# Patient Record
Sex: Male | Born: 1960 | Race: White | Hispanic: No | Marital: Married | Smoking: Never smoker
Health system: Southern US, Community
[De-identification: ages and names within clinical notes are randomized; demographics above are authoritative.]

## PROBLEM LIST (undated history)

## (undated) DIAGNOSIS — C4492 Squamous cell carcinoma of skin, unspecified: Secondary | ICD-10-CM

## (undated) DIAGNOSIS — Z8546 Personal history of malignant neoplasm of prostate: Secondary | ICD-10-CM

## (undated) DIAGNOSIS — C4491 Basal cell carcinoma of skin, unspecified: Secondary | ICD-10-CM

## (undated) DIAGNOSIS — L570 Actinic keratosis: Secondary | ICD-10-CM

## (undated) DIAGNOSIS — N529 Male erectile dysfunction, unspecified: Secondary | ICD-10-CM

## (undated) DIAGNOSIS — N200 Calculus of kidney: Secondary | ICD-10-CM

## (undated) HISTORY — DX: Male erectile dysfunction, unspecified: N52.9

## (undated) HISTORY — DX: Personal history of malignant neoplasm of prostate: Z85.46

## (undated) HISTORY — DX: Squamous cell carcinoma of skin, unspecified: C44.92

## (undated) HISTORY — DX: Actinic keratosis: L57.0

## (undated) HISTORY — DX: Calculus of kidney: N20.0

## (undated) HISTORY — PX: PROSTATE SURGERY: SHX751

## (undated) HISTORY — DX: Basal cell carcinoma of skin, unspecified: C44.91

---

## 2005-02-23 ENCOUNTER — Inpatient Hospital Stay (HOSPITAL_COMMUNITY): Admission: RE | Admit: 2005-02-23 | Discharge: 2005-02-26 | Payer: Self-pay | Admitting: Orthopedic Surgery

## 2005-03-09 ENCOUNTER — Encounter: Payer: Self-pay | Admitting: Orthopedic Surgery

## 2005-04-03 ENCOUNTER — Encounter: Payer: Self-pay | Admitting: Orthopedic Surgery

## 2005-05-03 ENCOUNTER — Encounter: Payer: Self-pay | Admitting: Orthopedic Surgery

## 2005-07-21 ENCOUNTER — Ambulatory Visit: Payer: Self-pay | Admitting: Internal Medicine

## 2005-11-28 ENCOUNTER — Inpatient Hospital Stay (HOSPITAL_COMMUNITY): Admission: RE | Admit: 2005-11-28 | Discharge: 2005-11-29 | Payer: Self-pay | Admitting: Urology

## 2005-12-30 ENCOUNTER — Encounter: Admission: RE | Admit: 2005-12-30 | Discharge: 2005-12-30 | Payer: Self-pay | Admitting: General Surgery

## 2006-02-14 ENCOUNTER — Ambulatory Visit (HOSPITAL_COMMUNITY): Admission: RE | Admit: 2006-02-14 | Discharge: 2006-02-14 | Payer: Self-pay | Admitting: General Surgery

## 2006-06-27 ENCOUNTER — Ambulatory Visit: Payer: Self-pay | Admitting: Orthopaedic Surgery

## 2006-08-01 ENCOUNTER — Ambulatory Visit: Payer: Self-pay | Admitting: Orthopaedic Surgery

## 2006-08-07 ENCOUNTER — Encounter: Payer: Self-pay | Admitting: Unknown Physician Specialty

## 2006-09-04 ENCOUNTER — Encounter: Payer: Self-pay | Admitting: Unknown Physician Specialty

## 2007-03-10 ENCOUNTER — Ambulatory Visit: Payer: Self-pay

## 2007-04-04 ENCOUNTER — Ambulatory Visit: Payer: Self-pay | Admitting: Pain Medicine

## 2007-04-12 ENCOUNTER — Ambulatory Visit: Payer: Self-pay | Admitting: Pain Medicine

## 2007-05-02 ENCOUNTER — Ambulatory Visit: Payer: Self-pay | Admitting: Physician Assistant

## 2007-12-26 ENCOUNTER — Ambulatory Visit: Payer: Self-pay | Admitting: Surgery

## 2009-05-05 ENCOUNTER — Ambulatory Visit: Payer: Self-pay | Admitting: Unknown Physician Specialty

## 2009-05-29 ENCOUNTER — Ambulatory Visit: Payer: Self-pay | Admitting: Unknown Physician Specialty

## 2009-06-09 ENCOUNTER — Ambulatory Visit: Payer: Self-pay | Admitting: Unknown Physician Specialty

## 2009-09-15 ENCOUNTER — Emergency Department: Payer: Self-pay | Admitting: Emergency Medicine

## 2009-09-17 ENCOUNTER — Inpatient Hospital Stay: Payer: Self-pay | Admitting: Urology

## 2009-09-24 ENCOUNTER — Ambulatory Visit: Payer: Self-pay | Admitting: Urology

## 2009-09-28 ENCOUNTER — Ambulatory Visit: Payer: Self-pay | Admitting: Urology

## 2009-09-29 ENCOUNTER — Ambulatory Visit: Payer: Self-pay | Admitting: Urology

## 2010-02-26 ENCOUNTER — Ambulatory Visit: Payer: Self-pay | Admitting: Urology

## 2010-08-27 ENCOUNTER — Ambulatory Visit: Payer: Self-pay | Admitting: Urology

## 2011-02-18 ENCOUNTER — Ambulatory Visit: Payer: Self-pay | Admitting: Urology

## 2011-08-19 ENCOUNTER — Ambulatory Visit: Payer: Self-pay | Admitting: Urology

## 2011-09-21 IMAGING — CR DG ABDOMEN 1V
1 series · 2 of 2 positions shown · non-contrast
Comparison: none

REASON FOR EXAM: nephrolithiasis
COMMENTS:

PROCEDURE:     DXR - DXR KIDNEY URETER BLADDER  - February 26, 2010 [DATE]
RESULT:     Comparison: 09/28/2009

[Series 1: view not recorded · 0.17mm/px · 2 of 2 slices shown]
[im 1/2]
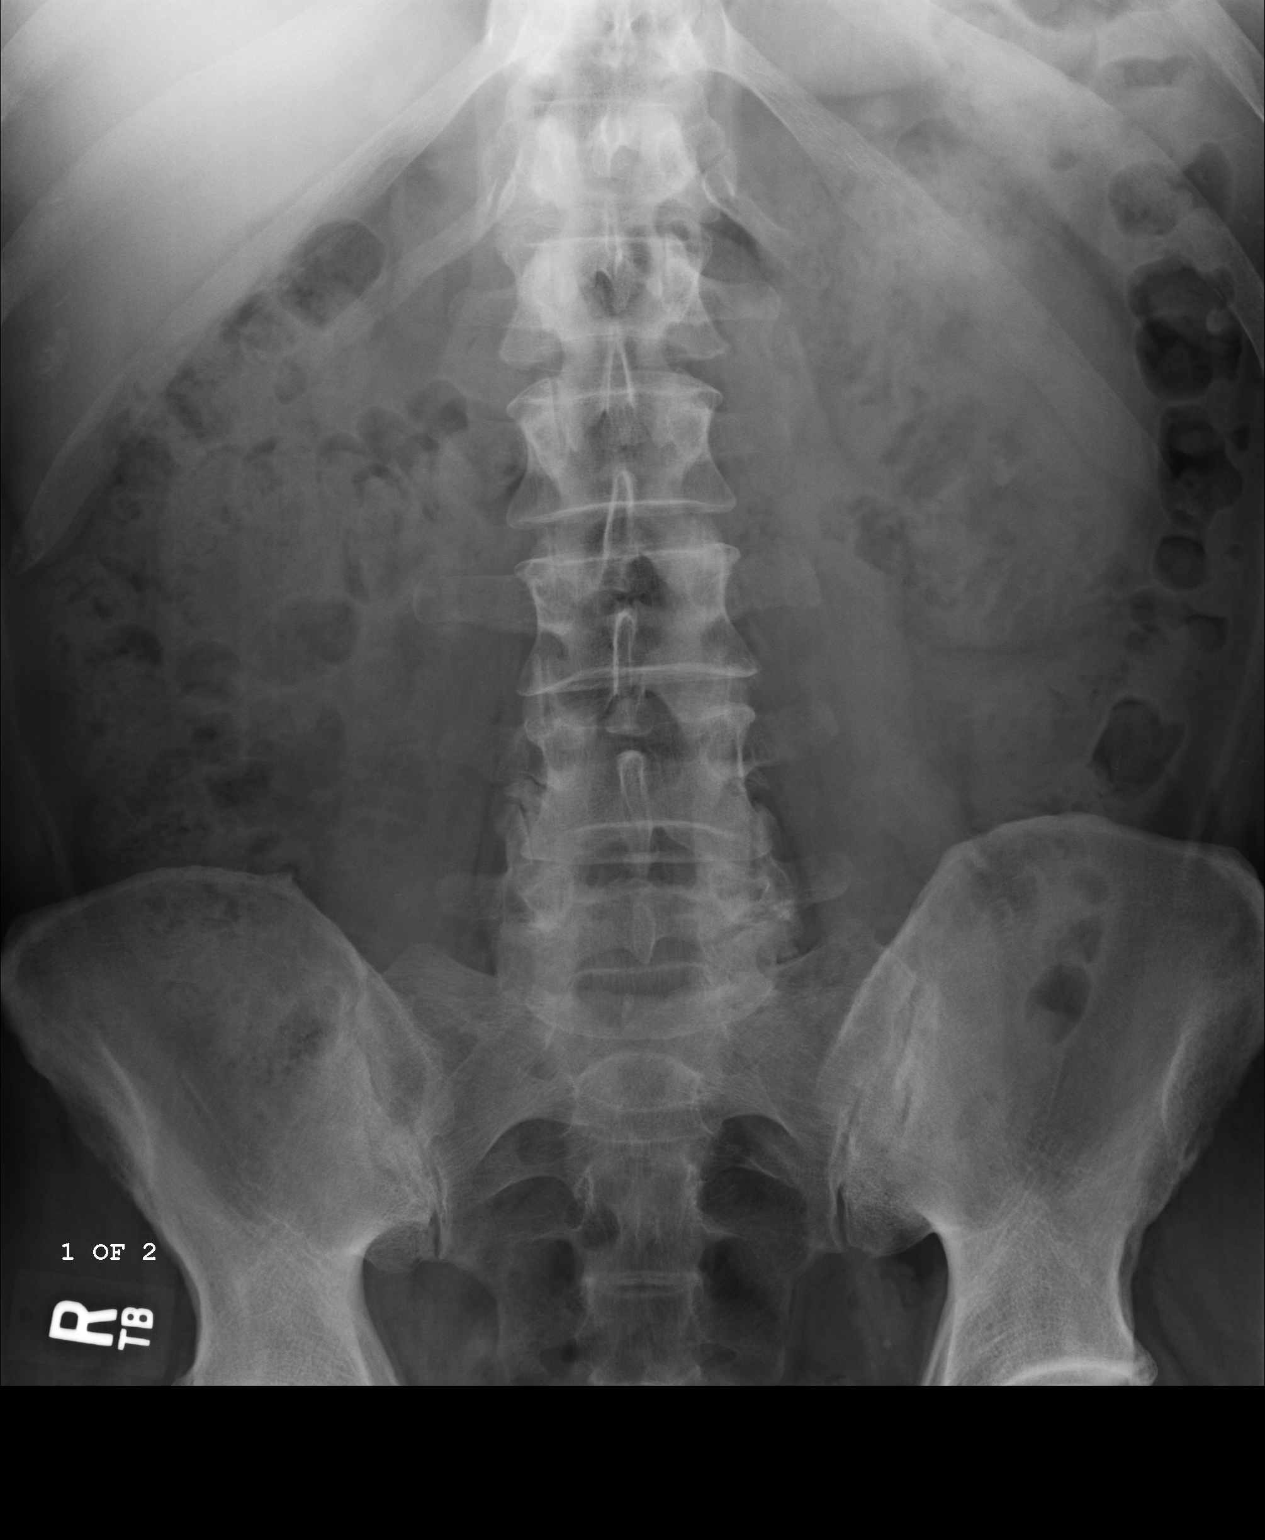
[im 2/2]
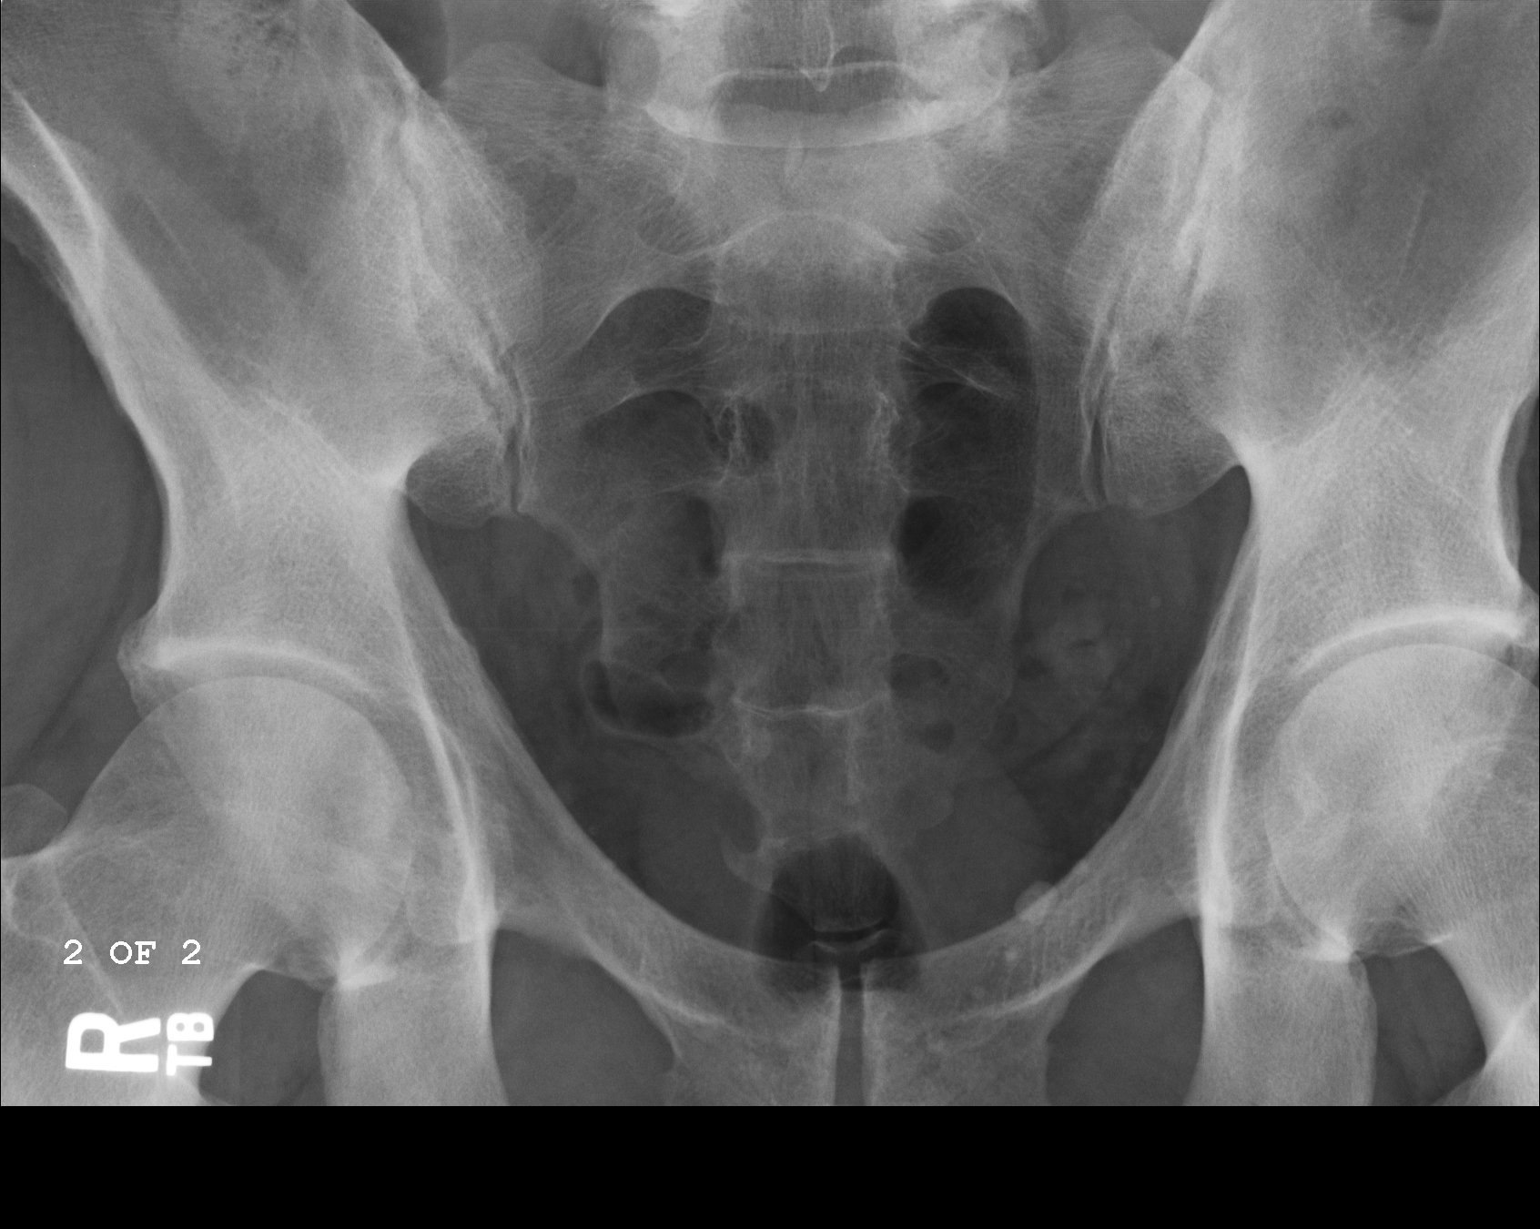

[2 of 2 positions shown; findings below may reference images not displayed]

FINDINGS: Small calculus overlying the left renal shadow is similar to prior. The
calculus overlying the expected location of the right proximal ureter is no
longer visualized. Calcifications in the pelvis are present represent
phleboliths.

Air is seen within nondilated small and large bowel.
IMPRESSION: Left-sided nephrolithiasis. Calculus overlying the left ureter no longer
visualized.

## 2012-02-15 DIAGNOSIS — E237 Disorder of pituitary gland, unspecified: Secondary | ICD-10-CM | POA: Insufficient documentation

## 2012-02-15 DIAGNOSIS — R6882 Decreased libido: Secondary | ICD-10-CM | POA: Insufficient documentation

## 2012-02-15 DIAGNOSIS — C61 Malignant neoplasm of prostate: Secondary | ICD-10-CM | POA: Insufficient documentation

## 2012-02-15 DIAGNOSIS — N529 Male erectile dysfunction, unspecified: Secondary | ICD-10-CM | POA: Insufficient documentation

## 2012-02-15 DIAGNOSIS — N200 Calculus of kidney: Secondary | ICD-10-CM | POA: Insufficient documentation

## 2012-10-05 ENCOUNTER — Ambulatory Visit: Payer: Self-pay | Admitting: Urology

## 2014-03-07 DIAGNOSIS — M5030 Other cervical disc degeneration, unspecified cervical region: Secondary | ICD-10-CM | POA: Insufficient documentation

## 2014-03-07 DIAGNOSIS — K219 Gastro-esophageal reflux disease without esophagitis: Secondary | ICD-10-CM | POA: Insufficient documentation

## 2014-03-07 DIAGNOSIS — Z8546 Personal history of malignant neoplasm of prostate: Secondary | ICD-10-CM | POA: Insufficient documentation

## 2015-04-10 ENCOUNTER — Ambulatory Visit (INDEPENDENT_AMBULATORY_CARE_PROVIDER_SITE_OTHER): Payer: 59

## 2015-04-10 ENCOUNTER — Encounter: Payer: Self-pay | Admitting: Sports Medicine

## 2015-04-10 ENCOUNTER — Ambulatory Visit (INDEPENDENT_AMBULATORY_CARE_PROVIDER_SITE_OTHER): Payer: 59 | Admitting: Sports Medicine

## 2015-04-10 DIAGNOSIS — M79671 Pain in right foot: Secondary | ICD-10-CM

## 2015-04-10 DIAGNOSIS — M2021 Hallux rigidus, right foot: Secondary | ICD-10-CM | POA: Diagnosis not present

## 2015-04-10 NOTE — Patient Instructions (Addendum)
Pre-Operative Instructions  Congratulations, you have decided to take an important step to improving your quality of life.  You can be assured that the doctors of [facility] will be with you every step of the way.  1. Plan to be at the surgery center/hospital at least 1 (one) hour prior to your scheduled time unless otherwise directed by the surgical center/hospital staff.  You must have a responsible adult accompany you, remain during the surgery and drive you home.  Make sure you have directions to the surgical center/hospital and know how to get there on time. 2. For hospital based surgery you will need to obtain a history and physical form from your family physician within 1 month prior to the date of surgery- we will give you a form for you primary physician.  3. We make every effort to accommodate the date you request for surgery.  There are however, times where surgery dates or times have to be moved.  We will contact you as soon as possible if a change in schedule is required.   4. No Aspirin/Ibuprofen for one week before surgery.  If you are on aspirin, any non-steroidal anti-inflammatory medications (Mobic, Aleve, Ibuprofen) you should stop taking it 7 days prior to your surgery.  You make take Tylenol  For pain prior to surgery.  5. Medications- If you are taking daily heart and blood pressure medications, seizure, reflux, allergy, asthma, anxiety, pain or diabetes medications, make sure the surgery center/hospital is aware before the day of surgery so they may notify you which medications to take or avoid the day of surgery. 6. No food or drink after midnight the night before surgery unless directed otherwise by surgical center/hospital staff. 7. No alcoholic beverages 24 hours prior to surgery.  No smoking 24 hours prior to or 24 hours after surgery. 8. Wear loose pants or shorts- loose enough to fit over bandages, boots, and casts. 9. No slip on shoes, sneakers are best. 10. Bring  your boot with you to the surgery center/hospital.  Also bring crutches or a walker if your physician has prescribed it for you.  If you do not have this equipment, it will be provided for you after surgery. 11. If you have not been contracted by the surgery center/hospital by the day before your surgery, call to confirm the date and time of your surgery. 12. Leave-time from work may vary depending on the type of surgery you have.  Appropriate arrangements should be made prior to surgery with your employer. 13. Prescriptions will be provided immediately following surgery by your doctor.  Have these filled as soon as possible after surgery and take the medication as directed. 14. Remove nail polish on the operative foot. 15. Wash the night before surgery.  The night before surgery wash the foot and leg well with the antibacterial soap provided and water paying special attention to beneath the toenails and in between the toes.  Rinse thoroughly with water and dry well with a towel.  Perform this wash unless told not to do so by your physician.  Enclosed: 1 Ice pack (please put in freezer the night before surgery)   1 Hibiclens skin cleaner   Pre-op Instructions  If you have any questions regarding the instructions, do not hesitate to call our office.  [place]: [address] [place], [place] [address] [phone]  [place]: [address] [place], [place] [address] [phone]  [place]: [address]  [place], [place] [address] [phone]  Dr. Richard   Tuchman DPM, Dr. Norman Regal DPM Dr. Richard Sikora DPM, Dr. M. Todd Hyatt DPM, Dr. Nicolas Banh DPM 

## 2015-04-10 NOTE — Progress Notes (Addendum)
Patient ID: Walter Hood, male   DOB: [birth year], 55 y.o.   MRN: [id number] Subjective: Walter Hood is a 55 y.o. male patient who presents to office for evaluation of Right bunion pain. Patient complains of progressive pain especially over the last year in the Right foot that starts as pain over the bump with direct pressure and range of motion; patient now has difficulty fitting shoes comfortably. Pain is now interferring with daily activities.  Patient has also tried change in shoe, range of motion, and rest without relief. Patient states that he may have started this by cracking his toes during Martial Arts, which could have possibly contributed to his symptoms as an injury in June of 2016. Patient denies any other pedal complaints.   Patient Active Problem List   Diagnosis Date Noted  . Degeneration of intervertebral disc of cervical region 03/07/2014  . Gastro-esophageal reflux disease without esophagitis 03/07/2014  . H/O malignant neoplasm of prostate 03/07/2014  . 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 Calculus of kidney 02/15/2012  . Decreased libido 02/15/2012  . ED (erectile dysfunction) of organic origin 02/15/2012  . Malignant neoplasm of prostate ([place]) 02/15/2012    No current outpatient prescriptions on file prior to visit.   No current facility-administered medications on file prior to visit.    No Known Allergies   Social History   Social History  . Marital Status: Married    Spouse Name: N/A  . Number of Children: N/A  . Years of Education: N/A   Social History Main Topics  . Smoking status: Unknown If Ever Smoked  . Smokeless tobacco: None  . Alcohol Use: None  . Drug Use: None  . Sexual Activity: Not Asked   Other Topics Concern  . None   Social History Narrative  . None   No past surgical history on file. Admits to over 25 surgeries since age 70 with no complications.   No family history on file.   Objective:  General: Alert and  oriented x3 in no acute distress  Dermatology: No open lesions bilateral lower extremities, no webspace macerations, no ecchymosis bilateral, all nails x 10 are well manicured.  Vascular: Dorsalis Pedis and Posterior Tibial pedal pulses 2/4, Capillary Fill Time 3 seconds, (+) pedal hair growth bilateral, no edema bilateral lower extremities, Temperature gradient within normal limits.  Neurology: Gross sensation intact via light touch bilateral. (-) Tinels sign right foot.   Musculoskeletal: Mild tenderness with palpation right bunion deformity with dorsal and medial promience, + limitation without crepitus on range of motion 10 deg dorsiflexion and 0 deg plantarflexion on right, left within normal limits, there is no 1st ray hypermobility noted bilateral. Asymptomatic lesser hammertoe R>L,  Midtarsal, Subtalar joint, and ankle joint range of motion is within normal limits bilateral. Strength within normal limits bilateral.  Xrays  Right Foot    Impression: Normal osseous mineralization, 1st MTPJ joint space narrowing, + dorsal flag sign and medial and lateral 1st MTPJ spurs, Intermetatarsal angle within upper limit of normal range, mild hammertoe 4-5, inferior heel spur, midtarsal beach, no fracture/dislocation, soft tissues within normal limits.      Assessment and Plan: Problem List Items Addressed This Visit    None    Visit Diagnoses    Right foot pain    -  Primary    Relevant Orders    DG Foot 2 Views Right    Hallux rigidus of right foot           -  Complete examination performed -Xrays reviewed -Discussed treatement options; discussed Hallus rigidus deformity;conservative and  Surgical management; risks, benefits, alternatives discussed. All patient's questions answered. -Patient opt for surgical management. Consent obtain for right youngswick bunionectomy/decompression osteotomy with internal fixation and placement of Stravix biologic at 1st MTPJ. Pre and Post op course explained.  Risks, benefits, alternatives explained. No guarantees given or implied. Surgical booking slip submitted and provided patient with Surgical packet and info for [facility]. Patient to call for date after he discusses with his wife.  -Dispensed CAM Walker to use post op -Recommend in the meantime to ice and elevate as needed and to continue with good supportive shoes and inserts.  -Patient to return to office after surgery or sooner if condition worsens.  [place] Martins, DPM

## 2015-04-14 ENCOUNTER — Telehealth: Payer: Self-pay | Admitting: *Deleted

## 2015-04-14 NOTE — Telephone Encounter (Signed)
"  I saw Dr. Cannon Kettle on Friday.  She told me to call you to schedule surgery.  I'd like to do it at the beginning of May.  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  This should give me plenty of time to get things arranged."  She can do it on May 8.  "That will be fine.  Thank you so much.  What time do I need to arrive?"  Someone from the surgical center will call you the Friday before with the arrival time.

## 2015-05-04 ENCOUNTER — Telehealth: Payer: Self-pay | Admitting: *Deleted

## 2015-05-04 NOTE — Telephone Encounter (Signed)
"  I'm calling to reschedule my surgery.  I was thinking that day may be a problem."  Do you have a date in mind?  "I'd like to reschedule it to June 26."  I'll get it rescheduled.  I called and rescheduled surgery at [facility] from 05/11/2015 to 06/29/2015.

## 2015-05-04 NOTE — Telephone Encounter (Signed)
"  I have surgery this Monday on the 8th.  I'd like to reschedule that.  Please call me as soon as possible and get that reschedule.  I don't want to cancel.  I do still need it but I need to get it rescheduled."

## 2015-05-19 ENCOUNTER — Encounter: Payer: Self-pay | Admitting: Sports Medicine

## 2015-06-05 DIAGNOSIS — Z0000 Encounter for general adult medical examination without abnormal findings: Secondary | ICD-10-CM | POA: Insufficient documentation

## 2015-06-18 DIAGNOSIS — M2021 Hallux rigidus, right foot: Secondary | ICD-10-CM

## 2015-06-26 ENCOUNTER — Telehealth: Payer: Self-pay | Admitting: Sports Medicine

## 2015-06-26 NOTE — Telephone Encounter (Signed)
PT BROUGHT BACK SURGERY BOOT. HE SAID JESSICA Q. SAID IT WAS OK TO TAKE CHARGE OFF.

## 2015-06-29 ENCOUNTER — Encounter: Payer: Self-pay | Admitting: Sports Medicine

## 2015-06-29 DIAGNOSIS — M2021 Hallux rigidus, right foot: Secondary | ICD-10-CM | POA: Diagnosis not present

## 2015-06-30 ENCOUNTER — Telehealth: Payer: Self-pay | Admitting: *Deleted

## 2015-06-30 ENCOUNTER — Telehealth: Payer: Self-pay | Admitting: Sports Medicine

## 2015-06-30 NOTE — Telephone Encounter (Signed)
Post op check phone call made.Wife answered and stated that husband is doing well. Only required Dilaudid once and has been taking Motrin. Denies constitutional symptoms. I encouraged ice and elevation and patient's wife to call office if any problems or issues arise. Patient to return as scheduled for post op care. -Dr. Cannon Kettle

## 2015-06-30 NOTE — Telephone Encounter (Addendum)
Post op courtesy call-Pt states he's doing fine, using crutches to get around, doesn't like the way the narcotics make him feel.  Pt states his toes are beginning to throb.  I instructed pt to check how fast his toes returned to their color after gently pressing, and if slow refill remove boot, open-ended sock, and ace wrap only, elevate the foot 15 minutes, then put foot level with hip and rewrap the ace looser, if throbbing worsens after removing the ace and elevating dangle the foot 15 minutes, then put foot level with hip and rewrap the ace looser replace sock and boot.  I instructed pt not to weight bear or dangle foot below the heart more than 15 mins/hour, keep dressing clean and dry, keep the boot on at all times may remove to ice or rotate ankle only and call with concerns.  Pt states understanding. Pt's wife called states pt is experiencing throbbing.  I told Lanelle Bal to remove boot, and ace wrap and elevate, she says they have done that, then she ask pt if the throbbing had subsided and he said it did. Lanelle Bal states pt has only had Ibuprofen at about noon, but took Dilaudid once yesterday.  I told Lanelle Bal to have pt take the pain medication as directed and go back in to the boot and call with concerns again, they were doing exactly as they had been instructed. 07/03/2015-Pt asked how long he was to be off his foot after surgery. I told pt generally it was at least 2-4 weeks but pt should ask Dr. Cannon Kettle at the 1st POV. 07/03/2015-Rebecca M. States she is requesting help with pt's [employer] claim.

## 2015-07-10 ENCOUNTER — Ambulatory Visit (INDEPENDENT_AMBULATORY_CARE_PROVIDER_SITE_OTHER): Payer: 59

## 2015-07-10 ENCOUNTER — Encounter: Payer: Self-pay | Admitting: Sports Medicine

## 2015-07-10 ENCOUNTER — Ambulatory Visit (INDEPENDENT_AMBULATORY_CARE_PROVIDER_SITE_OTHER): Payer: 59 | Admitting: Sports Medicine

## 2015-07-10 DIAGNOSIS — M79671 Pain in right foot: Secondary | ICD-10-CM

## 2015-07-10 DIAGNOSIS — Z9889 Other specified postprocedural states: Secondary | ICD-10-CM

## 2015-07-10 NOTE — Progress Notes (Signed)
Patient ID: Walter Hood, male   DOB: 1960-08-22, 55 y.o.   MRN: [id number] Subjective: Walter Hood is a 55 y.o. male patient seen today in office for POV #1 (DOS 06-29-15), S/P Right youngswick bunionectomy with placement of stravix allograft. Patient denies pain at surgical site, denies calf pain, denies headache, chest pain, shortness of breath, nausea, vomiting, fever, or chills. Patient states that he has paperwork that needs to be completed for leave/disability; patient states that he had injury that needs to be documented as well as the correct date of disability (which should be the date of surgery) on his paperwork and the forms given back to him or mailed to him; I explained to patient that at our first encounter in April that I documented the martial arts injury and further explained that because he had no imaging or nothing confirming extent of injury I can not completely say that the martial arts injury is completely related to pain that he experienced at the big toe joint. I further explained to patient that arthrtic changes could have been present before martial arts injury that were worsened by the injury or vice versa. Patient expressed understanding. No other issues noted.   Patient Active Problem List   Diagnosis Date Noted  . Encounter for general adult medical examination without abnormal findings 06/05/2015  . Degeneration of intervertebral disc of cervical region 03/07/2014  . Gastro-esophageal reflux disease without esophagitis 03/07/2014  . H/O malignant neoplasm of prostate 03/07/2014  . 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 Calculus of kidney 02/15/2012  . Decreased libido 02/15/2012  . ED (erectile dysfunction) of organic origin 02/15/2012  . Malignant neoplasm of prostate ([place]) 02/15/2012    Current Outpatient Prescriptions on File Prior to Visit  Medication Sig Dispense Refill  . aspirin EC 81 MG tablet Take 81 mg by mouth.    . Multiple Vitamin  (MULTI-VITAMINS) TABS Take by mouth.    . Omega-3 1000 MG CAPS Take by mouth.    . pantoprazole (PROTONIX) 40 MG tablet     . sildenafil (REVATIO) 20 MG tablet Take 20 mg by mouth.    . tadalafil (CIALIS) 5 MG tablet Take 5 mg by mouth.     No current facility-administered medications on file prior to visit.    No Known Allergies  Objective: There were no vitals filed for this visit.  General: No acute distress, AAOx3  Right foot: Sutures intact with no gapping or dehiscence at surgical site, mild swelling to right forefoot, no erythema, no warmth, no drainage, no signs of infection noted, Capillary fill time <3 seconds in all digits, gross sensation present via light touch to right foot. No pain or crepitation with range of motion right foot. No pain with calf compression.   Post Op Xray, Right foot: 1st MTPJ Excellent alignment and position with increased joint space as compared to pre-op xray. Osteotomy site healing. Hardware intact. Soft tissue swelling within normal limits for post op status.   Assessment and Plan:  Problem List Items Addressed This Visit    None    Visit Diagnoses    Right foot pain    -  Primary    Relevant Orders    DG Foot Complete Right    S/P foot surgery, right        06-29-15       -Patient seen and evaluated -Xray reviewed -Applied dry sterile dressing to surgical site right foot secured with ACE wrap and stockinet  -  Advised patient to make sure to keep dressings clean, dry, and intact to right surgical site, removing the ACE as needed  -Advised patient to continue with CAM boot on right foot and crutches with nonweightbearing as instructed   -Advised patient to limit activity to necessity  -Advised patient to ice and elevate as necessary  -Continue with PRN and pain meds as needed -Faxed continuing disability paperwork to office nurse to complete again on patient's behalf (see discussion comments in subjective portion of note) -Will plan for  suture removal at next office visit. In the meantime, patient to call office if any issues or problems arise.   [place] Martins, DPM

## 2015-07-14 DIAGNOSIS — N529 Male erectile dysfunction, unspecified: Secondary | ICD-10-CM

## 2015-07-14 DIAGNOSIS — N5231 Erectile dysfunction following radical prostatectomy: Secondary | ICD-10-CM | POA: Insufficient documentation

## 2015-07-17 ENCOUNTER — Encounter: Payer: Self-pay | Admitting: Sports Medicine

## 2015-07-17 ENCOUNTER — Ambulatory Visit (INDEPENDENT_AMBULATORY_CARE_PROVIDER_SITE_OTHER): Payer: 59 | Admitting: Sports Medicine

## 2015-07-17 DIAGNOSIS — Z9889 Other specified postprocedural states: Secondary | ICD-10-CM | POA: Diagnosis not present

## 2015-07-17 DIAGNOSIS — M79671 Pain in right foot: Secondary | ICD-10-CM | POA: Diagnosis not present

## 2015-07-17 DIAGNOSIS — M2021 Hallux rigidus, right foot: Secondary | ICD-10-CM

## 2015-07-17 NOTE — Progress Notes (Signed)
Patient ID: Walter Hood, male   DOB: 1960-07-10, 55 y.o.   MRN: [id number]   Subjective: Walter Hood is a 55 y.o. male patient seen today in office for POV #2 (DOS 06-29-15), S/P Right youngswick bunionectomy with placement of stravix allograft. Patient denies pain at surgical site, denies calf pain, denies headache, chest pain, shortness of breath, nausea, vomiting, fever, or chills. Patient states that he is frustrated with paperwork process for leave/disability otherwise No other issues noted.   Patient Active Problem List   Diagnosis Date Noted  . Failure of erection 07/14/2015  . Encounter for general adult medical examination without abnormal findings 06/05/2015  . Degeneration of intervertebral disc of cervical region 03/07/2014  . Gastro-esophageal reflux disease without esophagitis 03/07/2014  . H/O malignant neoplasm of prostate 03/07/2014  . 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 Calculus of kidney 02/15/2012  . Decreased libido 02/15/2012  . ED (erectile dysfunction) of organic origin 02/15/2012  . Malignant neoplasm of prostate ([place]) 02/15/2012    Current Outpatient Prescriptions on File Prior to Visit  Medication Sig Dispense Refill  . aspirin EC 81 MG tablet Take 81 mg by mouth.    . Multiple Vitamin (MULTI-VITAMINS) TABS Take by mouth.    . Omega-3 1000 MG CAPS Take by mouth.    . pantoprazole (PROTONIX) 40 MG tablet     . sildenafil (REVATIO) 20 MG tablet Take 20 mg by mouth.    . tadalafil (CIALIS) 5 MG tablet Take 5 mg by mouth.     No current facility-administered medications on file prior to visit.    No Known Allergies  Objective: There were no vitals filed for this visit.  General: No acute distress, AAOx3  Right foot: Sutures ends intact with no gapping or dehiscence at surgical site, mild swelling to right forefoot, no erythema, no warmth, no drainage, no signs of infection noted, Capillary fill time <3 seconds in all digits, gross sensation  present via light touch to right foot. No pain or crepitation with range of motion right foot. No pain with calf compression.   Assessment and Plan:  Problem List Items Addressed This Visit    None    Visit Diagnoses    S/P foot surgery, right    -  Primary    Right foot pain        Hallux rigidus of right foot           -Patient seen and evaluated -Suture ends removed and applied steristrips to surgical site, advised patient to shower as normal and to allow steristrips to fall off on their own -Dispensed compression stocking to assist with post surgical edema control -Patient may now bear weight with CAM boot for the next 2 weeks -Advised patient to start at home range of motion exercises as instructed -Advised patient to limit activity to necessity  -Advised patient to ice and elevate as necessary  -Continue with PRN and pain meds as needed -Patient to return in 2 weeks will plan to transition to post op shoe next visit. In the meantime, patient to call office if any issues or problems arise.   [place] Martins, DPM

## 2015-07-24 DIAGNOSIS — M79673 Pain in unspecified foot: Secondary | ICD-10-CM

## 2015-07-31 ENCOUNTER — Ambulatory Visit (INDEPENDENT_AMBULATORY_CARE_PROVIDER_SITE_OTHER): Payer: 59 | Admitting: Sports Medicine

## 2015-07-31 ENCOUNTER — Encounter: Payer: Self-pay | Admitting: Sports Medicine

## 2015-07-31 DIAGNOSIS — M2021 Hallux rigidus, right foot: Secondary | ICD-10-CM

## 2015-07-31 DIAGNOSIS — Z9889 Other specified postprocedural states: Secondary | ICD-10-CM

## 2015-07-31 DIAGNOSIS — M79671 Pain in right foot: Secondary | ICD-10-CM

## 2015-07-31 NOTE — Progress Notes (Signed)
Patient ID: [name], male   DOB: 1960-10-15, 55 y.o.   MRN: [id number]   Subjective: Walter Hood is a 55 y.o. male patient seen today in office for POV #3 (DOS 06-29-15), S/P Right youngswick bunionectomy with placement of stravix allograft. Patient denies pain at surgical site, states that sometimes with his range of motion exercises he has pain but otherwise denies calf pain, denies headache, chest pain, shortness of breath, nausea, vomiting, fever, or chills.  No other issues noted.   Patient Active Problem List   Diagnosis Date Noted  . Failure of erection 07/14/2015  . Encounter for general adult medical examination without abnormal findings 06/05/2015  . Degeneration of intervertebral disc of cervical region 03/07/2014  . Gastro-esophageal reflux disease without esophagitis 03/07/2014  . H/O malignant neoplasm of prostate 03/07/2014  . 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 Calculus of kidney 02/15/2012  . Decreased libido 02/15/2012  . ED (erectile dysfunction) of organic origin 02/15/2012  . Malignant neoplasm of prostate ([place]) 02/15/2012    Current Outpatient Prescriptions on File Prior to Visit  Medication Sig Dispense Refill  . aspirin EC 81 MG tablet Take 81 mg by mouth.    . Multiple Vitamin (MULTI-VITAMINS) TABS Take by mouth.    . Omega-3 1000 MG CAPS Take by mouth.    . pantoprazole (PROTONIX) 40 MG tablet     . sildenafil (REVATIO) 20 MG tablet Take 20 mg by mouth.    . tadalafil (CIALIS) 5 MG tablet Take 5 mg by mouth.     No current facility-administered medications on file prior to visit.     No Known Allergies  Objective: There were no vitals filed for this visit.  General: No acute distress, AAOx3  Right foot:Incision well healed, mild swelling to right forefoot, no erythema, no warmth, no drainage, no signs of infection noted, Capillary fill time <3 seconds in all digits, gross sensation present via light touch to right foot. No pain or  crepitation with range of motion right foot. No pain with calf compression.   Assessment and Plan:  Problem List Items Addressed This Visit    None    Visit Diagnoses    S/P foot surgery, right    -  Primary   Right foot pain       Hallux rigidus of right foot          -Patient seen and evaluated -Continue with compression stocking to assist with post surgical edema control -Patient may now wear post op shoe of which he bought with him for the next 2 weeks with slow transition to normal shoe -Advised patient to continue at home range of motion exercises as instructed -Advised patient to limit activity to tolerance -Advised patient to ice and elevate as necessary  -Continue with PRN and pain meds as needed -Continue with no work/leave -Patient to return in 4 weeks will plan to xray at next visit. In the meantime, patient to call office if any issues or problems arise.   [place] Martins, DPM

## 2015-09-01 ENCOUNTER — Ambulatory Visit (INDEPENDENT_AMBULATORY_CARE_PROVIDER_SITE_OTHER): Payer: 59 | Admitting: Sports Medicine

## 2015-09-01 ENCOUNTER — Encounter: Payer: Self-pay | Admitting: Sports Medicine

## 2015-09-01 ENCOUNTER — Ambulatory Visit (INDEPENDENT_AMBULATORY_CARE_PROVIDER_SITE_OTHER): Payer: 59

## 2015-09-01 DIAGNOSIS — M2021 Hallux rigidus, right foot: Secondary | ICD-10-CM

## 2015-09-01 DIAGNOSIS — M79671 Pain in right foot: Secondary | ICD-10-CM

## 2015-09-01 DIAGNOSIS — Z9889 Other specified postprocedural states: Secondary | ICD-10-CM

## 2015-09-01 NOTE — Progress Notes (Signed)
Patient ID: Walter Hood, male   DOB: April 01, 1960, 55 y.o.   MRN: [id number]   Subjective: Walter Hood is a 55 y.o. male patient seen today in office for POV #4 (DOS 06-29-15), S/P Right [place] bunionectomy with placement of stravix allograft. Patient denies pain at surgical site, states that sometimes with his range of motion exercises he has pain and has a little limitation with dorsiflexion but feels like its improved and is recovering as expected otherwise denies calf pain, denies headache, chest pain, shortness of breath, nausea, vomiting, fever, or chills.  No other issues noted.   Patient Active Problem List   Diagnosis Date Noted  . Failure of erection 07/14/2015  . Encounter for general adult medical examination without abnormal findings 06/05/2015  . Degeneration of intervertebral disc of cervical region 03/07/2014  . Gastro-esophageal reflux disease without esophagitis 03/07/2014  . H/O malignant neoplasm of prostate 03/07/2014  . 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 Calculus of kidney 02/15/2012  . Decreased libido 02/15/2012  . ED (erectile dysfunction) of organic origin 02/15/2012  . Malignant neoplasm of prostate ([place]) 02/15/2012    Current Outpatient Prescriptions on File Prior to Visit  Medication Sig Dispense Refill  . aspirin EC 81 MG tablet Take 81 mg by mouth.    . Multiple Vitamin (MULTI-VITAMINS) TABS Take by mouth.    . Omega-3 1000 MG CAPS Take by mouth.    . pantoprazole (PROTONIX) 40 MG tablet     . sildenafil (REVATIO) 20 MG tablet Take 20 mg by mouth.    . tadalafil (CIALIS) 5 MG tablet Take 5 mg by mouth.     No current facility-administered medications on file prior to visit.     No Known Allergies  Objective: There were no vitals filed for this visit.  General: No acute distress, AAOx3  Right foot:Incision well healed, mild swelling to right 1st MTPJ, no erythema, no warmth, no drainage, no signs of infection noted, Capillary fill  time <3 seconds in all digits, gross sensation present via light touch to right foot. No pain or crepitation with range of motion right foot. No pain with calf compression.   Xrays, Right foot: Hardware intact, osteotomy site healed well, 1st MTPJ joint space preserved, no other acute findings.  Assessment and Plan:  Problem List Items Addressed This Visit    None    Visit Diagnoses    S/P foot surgery, right    -  Primary   Relevant Orders   DG Foot Complete Right   Right foot pain       Relevant Orders   DG Foot Complete Right   Hallux rigidus of right foot       Relevant Orders   DG Foot Complete Right      -Patient seen and evaluated -xrays reviewed -Continue with compression stocking to assist with post surgical edema control as needed -Continue with normal shoe and transition to using work boots in preparation for return to work 09-21-15 -Advised patient to continue at home range of motion exercises as instructed -Advised patient to limit activity to tolerance -Advised patient to ice and elevate as necessary  -Continue with PRN and pain meds as needed -Continue with no work/leave until 09-21-15 -Patient to return as needed. In the meantime, patient to call office if any issues or problems arise.   Walter Hood, DPM

## 2015-10-01 NOTE — Progress Notes (Signed)
DOS 06.26.2017 Right 1st Metatarsal Decompression Osteotomy with Internal Fixation; Removal of Bone Spurs and Placement of Amniotic Graft

## 2018-08-07 ENCOUNTER — Ambulatory Visit: Payer: Self-pay | Admitting: Urology

## 2018-08-09 ENCOUNTER — Ambulatory Visit
Admission: RE | Admit: 2018-08-09 | Discharge: 2018-08-09 | Disposition: A | Discharge status: Home / Self Care | Payer: Managed Care, Other (non HMO) | Source: Ambulatory Visit | Attending: Urology | Admitting: Urology

## 2018-08-09 ENCOUNTER — Other Ambulatory Visit: Payer: Self-pay

## 2018-08-09 ENCOUNTER — Encounter: Payer: Self-pay | Admitting: Urology

## 2018-08-09 ENCOUNTER — Ambulatory Visit: Payer: Managed Care, Other (non HMO) | Admitting: Urology

## 2018-08-09 VITALS — BP 137/79 | HR 61 | Ht 73.75 in | Wt 237.1 lb

## 2018-08-09 DIAGNOSIS — Z8546 Personal history of malignant neoplasm of prostate: Secondary | ICD-10-CM

## 2018-08-09 DIAGNOSIS — N5231 Erectile dysfunction following radical prostatectomy: Secondary | ICD-10-CM

## 2018-08-09 DIAGNOSIS — M7520 Bicipital tendinitis, unspecified shoulder: Secondary | ICD-10-CM | POA: Insufficient documentation

## 2018-08-09 DIAGNOSIS — M542 Cervicalgia: Secondary | ICD-10-CM | POA: Insufficient documentation

## 2018-08-09 DIAGNOSIS — N200 Calculus of kidney: Secondary | ICD-10-CM

## 2018-08-09 DIAGNOSIS — Z96659 Presence of unspecified artificial knee joint: Secondary | ICD-10-CM | POA: Insufficient documentation

## 2018-08-09 DIAGNOSIS — Z9889 Other specified postprocedural states: Secondary | ICD-10-CM | POA: Insufficient documentation

## 2018-08-09 DIAGNOSIS — M75120 Complete rotator cuff tear or rupture of unspecified shoulder, not specified as traumatic: Secondary | ICD-10-CM | POA: Insufficient documentation

## 2018-08-09 DIAGNOSIS — M19019 Primary osteoarthritis, unspecified shoulder: Secondary | ICD-10-CM | POA: Insufficient documentation

## 2018-08-09 DIAGNOSIS — K4090 Unilateral inguinal hernia, without obstruction or gangrene, not specified as recurrent: Secondary | ICD-10-CM | POA: Insufficient documentation

## 2018-08-09 DIAGNOSIS — Z8719 Personal history of other diseases of the digestive system: Secondary | ICD-10-CM | POA: Insufficient documentation

## 2018-08-09 DIAGNOSIS — G5750 Tarsal tunnel syndrome, unspecified lower limb: Secondary | ICD-10-CM | POA: Insufficient documentation

## 2018-08-09 DIAGNOSIS — M719 Bursopathy, unspecified: Secondary | ICD-10-CM | POA: Insufficient documentation

## 2018-08-09 DIAGNOSIS — M25529 Pain in unspecified elbow: Secondary | ICD-10-CM | POA: Insufficient documentation

## 2018-08-09 DIAGNOSIS — M4802 Spinal stenosis, cervical region: Secondary | ICD-10-CM | POA: Insufficient documentation

## 2018-08-09 DIAGNOSIS — M25519 Pain in unspecified shoulder: Secondary | ICD-10-CM | POA: Insufficient documentation

## 2018-08-09 LAB — URINALYSIS, COMPLETE
Bilirubin, UA: NEGATIVE
Glucose, UA: NEGATIVE
Ketones, UA: NEGATIVE
Leukocytes,UA: NEGATIVE
Nitrite, UA: NEGATIVE
Protein,UA: NEGATIVE
Specific Gravity, UA: 1.025 (ref 1.005–1.030)
Urobilinogen, Ur: 0.2 mg/dL (ref 0.2–1.0)
pH, UA: 7 (ref 5.0–7.5)

## 2018-08-09 LAB — MICROSCOPIC EXAMINATION
Bacteria, UA: NONE SEEN
Epithelial Cells (non renal): NONE SEEN /hpf (ref 0–10)
WBC, UA: NONE SEEN /hpf (ref 0–5)

## 2018-08-09 NOTE — Progress Notes (Signed)
08/09/2018 9:51 AM   Walter Hood September 22, 1960 [id number]  Referring provider: Kirk Ruths, MD [facility] [place],  [place] [address]  Chief Complaint  Patient presents with  . Establish Care    Urologic history: 1.  Personal history prostate cancer  - RALP Dr. Alinda Money 2007  -Undetectable PSA  2.  Nephrolithiasis  -Recurrent stone disease  -12 mm left renal calculus  3.  Erectile dysfunction  -Post prostatectomy  -Awaiting penile prosthesis Dr. Otho Najjar  HPI: Walter Hood is a 58 y.o. male who presents to establish local urologic care.  He most recently has been followed by Dr. Jacqlyn Larsen and last saw him for an annual follow-up in March 2019.  He has seen Dr. Francesca Jewett for penile implant and was scheduled however had insurance issues and is in the process of an appeal.  Since his last visit with Dr. Jacqlyn Larsen he denies flank, abdominal, pelvic or scrotal pain.  He has no bothersome lower urinary tract symptoms.  Denies gross hematuria.  He had a PSA with Dr. Ouida Sills June 2020 which was undetectable at <0.1.  He also gets his testosterone checked annually which was 332.  PMH: Past Medical History:  Diagnosis Date  . ED (erectile dysfunction)     Surgical History: . FOOT SURGERY 07/2015  . INGUINAL HERNIA REPAIR Bilateral  with use of mesh  . PROSTATECTOMY 2007  . REPLACEMENT TOTAL KNEE  . right shouler surgery   Home Medications:  Allergies as of 08/09/2018   No Known Allergies     Medication List       Accurate as of August 09, 2018  9:51 AM. If you have any questions, ask your nurse or doctor.        STOP taking these medications   Cialis 5 MG tablet Generic drug: tadalafil Stopped by: Abbie Sons, MD   docusate sodium 100 MG capsule Commonly known as: COLACE Stopped by: Abbie Sons, MD   HYDROmorphone 4 MG tablet Commonly known as: DILAUDID Stopped by: Abbie Sons, MD   Multi-Vitamins Tabs Stopped by:  Abbie Sons, MD   promethazine 25 MG tablet Commonly known as: PHENERGAN Stopped by: Abbie Sons, MD   sildenafil 20 MG tablet Commonly known as: REVATIO Stopped by: Abbie Sons, MD     TAKE these medications   aspirin EC 81 MG tablet Take 81 mg by mouth.   MULTIVITAMIN ADULT PO Take by mouth daily.   Omega-3 1000 MG Caps Take by mouth.   pantoprazole 40 MG tablet Commonly known as: PROTONIX       Allergies: No Known Allergies  Family History: No family history on file.  Social History:  has no history on file for tobacco, alcohol, and drug.  ROS: UROLOGY Frequent Urination?: No Hard to postpone urination?: No Burning/pain with urination?: No Get up at night to urinate?: No Leakage of urine?: No Urine stream starts and stops?: No Trouble starting stream?: No Do you have to strain to urinate?: No Blood in urine?: No Urinary tract infection?: No Sexually transmitted disease?: No Injury to kidneys or bladder?: No Painful intercourse?: No Weak stream?: No Erection problems?: No Penile pain?: No  Gastrointestinal Nausea?: No Vomiting?: No Indigestion/heartburn?: No Diarrhea?: No Constipation?: No  Constitutional Fever: No Night sweats?: No Weight loss?: No Fatigue?: No  Skin Skin rash/lesions?: No Itching?: No  Eyes Blurred vision?: No Double vision?: No  Ears/Nose/Throat Sore throat?: No Sinus  problems?: No  Hematologic/Lymphatic Swollen glands?: No Easy bruising?: No  Cardiovascular Leg swelling?: No Chest pain?: No  Respiratory Cough?: No Shortness of breath?: No  Endocrine Excessive thirst?: No  Musculoskeletal Back pain?: No Joint pain?: No  Neurological Headaches?: No Dizziness?: No  Psychologic Depression?: No Anxiety?: No  Physical Exam: BP 137/79 (BP Location: Left Arm, Patient Position: Sitting, Cuff Size: Normal)   Pulse 61   Ht 6' 1.75" (1.873 m)   Wt 237 lb 1.6 oz (107.5 kg)   BMI 30.65  kg/m   Constitutional:  Alert and oriented, No acute distress. HEENT: [place] AT, moist mucus membranes.  Trachea midline, no masses. Cardiovascular: No clubbing, cyanosis, or edema. Respiratory: Normal respiratory effort, no increased work of breathing. GI: Abdomen is soft, nontender, nondistended, no abdominal masses GU: No CVA tenderness Lymph: No cervical or inguinal lymphadenopathy. Skin: No rashes, bruises or suspicious lesions. Neurologic: Grossly intact, no focal deficits, moving all 4 extremities. Psychiatric: Normal mood and affect.   Assessment & Plan:    - Personal history prostate cancer PSA remains undetectable post radical prostatectomy  - Nephrolithiasis KUB ordered today and he will be notified with results.  - Erectile dysfunction Awaiting penile prosthesis Dr. Francesca Jewett at [place] follow-up   Abbie Sons, [place] [address], [place], [place] [address] [phone]

## 2018-08-10 ENCOUNTER — Telehealth: Payer: Self-pay

## 2018-08-10 NOTE — Telephone Encounter (Signed)
Pt. Called back I scheduled him for 6 month F/U with KUB prior.

## 2018-08-10 NOTE — Telephone Encounter (Signed)
-----   Message from Abbie Sons, MD sent at 08/10/2018  1:21 PM EDT ----- The left renal calculus has increased slightly in size.  It measures 17 mm and the prior x-ray at [facility] measured 14 mm.  Small right lower pole calculi are stable.  Since the stone is increasing in size I would recommend a repeat KUB only in 6 months.

## 2018-08-10 NOTE — Telephone Encounter (Signed)
Left pt mess to call 

## 2018-11-02 ENCOUNTER — Encounter: Payer: Self-pay | Admitting: Dermatology

## 2018-11-07 ENCOUNTER — Other Ambulatory Visit: Payer: Self-pay

## 2018-11-07 ENCOUNTER — Emergency Department
Admission: EM | Admit: 2018-11-07 | Discharge: 2018-11-08 | Disposition: A | Discharge status: Home / Self Care | Payer: Managed Care, Other (non HMO) | Attending: Emergency Medicine | Admitting: Emergency Medicine

## 2018-11-07 ENCOUNTER — Emergency Department: Payer: Managed Care, Other (non HMO)

## 2018-11-07 DIAGNOSIS — Z8546 Personal history of malignant neoplasm of prostate: Secondary | ICD-10-CM | POA: Diagnosis not present

## 2018-11-07 DIAGNOSIS — Z79899 Other long term (current) drug therapy: Secondary | ICD-10-CM | POA: Insufficient documentation

## 2018-11-07 DIAGNOSIS — N23 Unspecified renal colic: Secondary | ICD-10-CM | POA: Diagnosis not present

## 2018-11-07 DIAGNOSIS — Z7982 Long term (current) use of aspirin: Secondary | ICD-10-CM | POA: Insufficient documentation

## 2018-11-07 DIAGNOSIS — R109 Unspecified abdominal pain: Secondary | ICD-10-CM | POA: Diagnosis present

## 2018-11-07 LAB — COMPREHENSIVE METABOLIC PANEL
ALT: 25 U/L (ref 0–44)
AST: 28 U/L (ref 15–41)
Albumin: 4.1 g/dL (ref 3.5–5.0)
Alkaline Phosphatase: 57 U/L (ref 38–126)
Anion gap: 9 (ref 5–15)
BUN: 17 mg/dL (ref 6–20)
CO2: 25 mmol/L (ref 22–32)
Calcium: 9.5 mg/dL (ref 8.9–10.3)
Chloride: 106 mmol/L (ref 98–111)
Creatinine, Ser: 0.92 mg/dL (ref 0.61–1.24)
GFR calc Af Amer: >60 mL/min (ref >60)
GFR calc non Af Amer: >60 mL/min (ref >60)
Glucose, Bld: 127 mg/dL — ABNORMAL HIGH (ref 70–99)
Potassium: 3.8 mmol/L (ref 3.5–5.1)
Sodium: 140 mmol/L (ref 135–145)
Total Bilirubin: 0.8 mg/dL (ref 0.3–1.2)
Total Protein: 7.1 g/dL (ref 6.5–8.1)

## 2018-11-07 LAB — CBC
HCT: 41.5 % (ref 39.0–52.0)
Hemoglobin: 14.2 g/dL (ref 13.0–17.0)
MCH: 29.9 pg (ref 26.0–34.0)
MCHC: 34.2 g/dL (ref 30.0–36.0)
MCV: 87.4 fL (ref 80.0–100.0)
Platelets: 154 10*3/uL (ref 150–400)
RBC: 4.75 MIL/uL (ref 4.22–5.81)
RDW: 13.3 % (ref 11.5–15.5)
WBC: 7.3 10*3/uL (ref 4.0–10.5)
nRBC: 0 % (ref 0.0–0.2)

## 2018-11-07 LAB — URINALYSIS, COMPLETE (UACMP) WITH MICROSCOPIC
Bacteria, UA: NONE SEEN
Bilirubin Urine: NEGATIVE
Glucose, UA: NEGATIVE mg/dL
Ketones, ur: NEGATIVE mg/dL
Leukocytes,Ua: NEGATIVE
Nitrite: NEGATIVE
Protein, ur: 30 mg/dL — AB
RBC / HPF: >50 RBC/hpf — ABNORMAL HIGH (ref 0–5)
Specific Gravity, Urine: 1.015 (ref 1.005–1.030)
Squamous Epithelial / LPF: NONE SEEN (ref 0–5)
pH: 6 (ref 5.0–8.0)

## 2018-11-07 NOTE — ED Triage Notes (Signed)
Pt in with co bloody urine that started today. Does have hx of kidney stones, now having right flank pain.

## 2018-11-08 MED ORDER — TAMSULOSIN HCL 0.4 MG PO CAPS
0.4000 mg | ORAL_CAPSULE | Freq: Once | ORAL | Status: AC
  Administered 2018-11-08: 01:00:00 0.4 mg via ORAL
  Filled 2018-11-08: qty 1

## 2018-11-08 MED ORDER — TAMSULOSIN HCL 0.4 MG PO CAPS: 0.4000 mg | ORAL_CAPSULE | Freq: Every day | ORAL | 0 refills | Status: DC

## 2018-11-08 MED ORDER — HYDROMORPHONE HCL 2 MG PO TABS: 2.0000 mg | ORAL_TABLET | Freq: Four times a day (QID) | ORAL | 0 refills | Status: DC | PRN

## 2018-11-08 MED ORDER — ONDANSETRON 4 MG PO TBDP: 4.0000 mg | ORAL_TABLET | Freq: Three times a day (TID) | ORAL | 0 refills | Status: DC | PRN

## 2018-11-08 MED ORDER — SODIUM CHLORIDE 0.9 % IV BOLUS
1000.0000 mL | Freq: Once | INTRAVENOUS | Status: AC
  Administered 2018-11-08: 01:00:00 1000 mL via INTRAVENOUS

## 2018-11-08 NOTE — ED Notes (Signed)
No answer when called several times from lobby; attempted to call pt's cell phone with no answer

## 2018-11-08 NOTE — ED Notes (Signed)
MD at bedside. 

## 2018-11-08 NOTE — Discharge Instructions (Signed)
1. Take pain & nausea medicines as needed (Dilaudid/Zofran #20). Make sure to take a stool softener while taking narcotic pain medicines. 2. Take Flomax 0.4mg  daily x 14 days. 3. Drink plenty of bottled or filtered water daily. 4. Return to the ER for worsening symptoms, persistent vomiting, fever, difficulty breathing or other concerns.

## 2018-11-08 NOTE — ED Notes (Signed)
Patient sitting in bed watching TV. No complaints, stable, in no acute distress.

## 2018-11-08 NOTE — ED Provider Notes (Signed)
[facility] Emergency Department Provider Note   ____________________________________________   First MD Initiated Contact with Patient 11/08/18 0011     (approximate)  I have reviewed the triage vital signs and the nursing notes.   HISTORY  Chief Complaint Hematuria    HPI Walter Hood is a 58 y.o. male who presents to the ED from home with a chief complaint right flank pain.  Patient reports pink-tinged hematuria with right flank pain which started yesterday.  History of same status post lithotripsy and extraction multiple years ago.  Denies associated fever, cough, chest pain, shortness of breath, abdominal pain, nausea or vomiting.       Past Medical History:  Diagnosis Date  . ED (erectile dysfunction)   . History of prostate cancer   . Kidney stone     Patient Active Problem List   Diagnosis Date Noted  . Tarsal tunnel syndrome 08/09/2018  . Spinal stenosis in cervical region 08/09/2018  . Shoulder joint pain 08/09/2018  . Pain in elbow 08/09/2018  . Neck pain 08/09/2018  . Localized, primary osteoarthritis of shoulder region 08/09/2018  . Inguinal hernia 08/09/2018  . History of total knee arthroplasty 08/09/2018  . History of repair of inguinal hernia 08/09/2018  . Full thickness rotator cuff tear 08/09/2018  . Bicipital tenosynovitis 08/09/2018  . Disorder of bursae of shoulder region 08/09/2018  . Erectile dysfunction after radical prostatectomy 07/14/2015  . Encounter for general adult medical examination without abnormal findings 06/05/2015  . Degeneration of intervertebral disc of cervical region 03/07/2014  . Gastro-esophageal reflux disease without esophagitis 03/07/2014  . History of prostate cancer 03/07/2014  . 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 Calculus of kidney 02/15/2012  . Decreased libido 02/15/2012  . ED (erectile dysfunction) of organic origin 02/15/2012  . Malignant neoplasm of prostate ([place])  02/15/2012    Past Surgical History:  Procedure Laterality Date  . PROSTATE SURGERY    . PROSTATECTOMY  2007    Prior to Admission medications   Medication Sig Start Date End Date Taking? Authorizing Provider  aspirin EC 81 MG tablet Take 81 mg by mouth.    [provider]  HYDROmorphone (DILAUDID) 2 MG tablet Take 1 tablet (2 mg total) by mouth every 6 (six) hours as needed for severe pain. 11/08/18   Paulette Blanch, MD  Multiple Vitamins-Minerals (MULTIVITAMIN ADULT PO) Take by mouth daily.    [provider]  Omega-3 1000 MG CAPS Take by mouth.    [provider]  ondansetron (ZOFRAN ODT) 4 MG disintegrating tablet Take 1 tablet (4 mg total) by mouth every 8 (eight) hours as needed for nausea or vomiting. 11/08/18   Paulette Blanch, MD  pantoprazole (PROTONIX) 40 MG tablet  04/25/14   [provider]  tamsulosin (FLOMAX) 0.4 MG CAPS capsule Take 1 capsule (0.4 mg total) by mouth daily. 11/08/18   Paulette Blanch, MD    Allergies Patient has no known allergies.  No family history on file.  Social History Social History   Tobacco Use  . Smoking status: Never Smoker  . Smokeless tobacco: Never Used  Substance Use Topics  . Alcohol use: Not Currently    Alcohol/week: 0.0 standard drinks  . Drug use: Never    Review of Systems  Constitutional: No fever/chills Eyes: No visual changes. ENT: No sore throat. Cardiovascular: Denies chest pain. Respiratory: Denies shortness of breath. Gastrointestinal: Positive for right flank pain.  No abdominal pain.  No nausea, no vomiting.  No diarrhea.  No constipation. Genitourinary: Positive for hematuria.  Negative for dysuria. Musculoskeletal: Negative for back pain. Skin: Negative for rash. Neurological: Negative for headaches, focal weakness or numbness.   ____________________________________________   PHYSICAL EXAM:  VITAL SIGNS: ED Triage Vitals  Enc Vitals Group     BP 11/07/18 2233 (!) 148/112      Pulse Rate 11/07/18 2233 66     Resp 11/07/18 2233 20     Temp 11/07/18 2233 (!) 97.5 F (36.4 C)     Temp Source 11/07/18 2233 Oral     SpO2 11/07/18 2233 100 %     Weight 11/07/18 2234 231 lb (104.8 kg)     Height 11/07/18 2234 6\' 1"  (1.854 m)     Head Circumference --      Peak Flow --      Pain Score 11/07/18 2234 6     Pain Loc --      Pain Edu? --      Excl. in [place]? --     Constitutional: Alert and oriented. Well appearing and in no acute distress. Eyes: Conjunctivae are normal. PERRL. EOMI. Head: Atraumatic. Nose: No congestion/rhinnorhea. Mouth/Throat: Mucous membranes are moist.  Oropharynx non-erythematous. Neck: No stridor.   Cardiovascular: Normal rate, regular rhythm. Grossly normal heart sounds.  Good peripheral circulation. Respiratory: Normal respiratory effort.  No retractions. Lungs CTAB. Gastrointestinal: Soft and nontender to light or deep palpation. No distention. No abdominal bruits.  Very mild right CVA tenderness. Musculoskeletal: No lower extremity tenderness nor edema.  No joint effusions. Neurologic:  Normal speech and language. No gross focal neurologic deficits are appreciated. No gait instability. Skin:  Skin is warm, dry and intact. No rash noted. Psychiatric: Mood and affect are normal. Speech and behavior are normal.  ____________________________________________   LABS (all labs ordered are listed, but only abnormal results are displayed)  Labs Reviewed  COMPREHENSIVE METABOLIC PANEL - Abnormal; Notable for the following components:      Result Value   Glucose, Bld 127 (*)    All other components within normal limits  URINALYSIS, COMPLETE (UACMP) WITH MICROSCOPIC - Abnormal; Notable for the following components:   Color, Urine AMBER (*)    APPearance CLOUDY (*)    Hgb urine dipstick LARGE (*)    Protein, ur 30 (*)    RBC / HPF >50 (*)    All other components within normal limits  CBC   ____________________________________________   EKG  None ____________________________________________  RADIOLOGY  ED MD interpretation: Bilateral nephrolithiasis; 5 mm mid right ureteral stone with mild right hydronephrosis  Official radiology report(s): Ct Renal Stone Study  Result Date: 11/07/2018 CLINICAL DATA:  Hematuria.  Flank pain EXAM: CT ABDOMEN AND PELVIS WITHOUT CONTRAST TECHNIQUE: Multidetector CT imaging of the abdomen and pelvis was performed following the standard protocol without IV contrast. COMPARISON:  09/15/2009 FINDINGS: Lower chest: Lung bases are clear. No effusions. Heart is normal size. Hepatobiliary: No focal hepatic abnormality. Gallbladder unremarkable. Pancreas: No focal abnormality or ductal dilatation. Spleen: No focal abnormality.  Normal size. Adrenals/Urinary Tract: Mild right hydronephrosis. 5 mm mid right ureteral stone at the pelvic brim. Multiple bilateral renal stones, the largest in the mid to lower pole of the left kidney measuring up to 19 mm. No hydronephrosis on the left. 4.2 cm cyst posteriorly in the midpole of the left kidney. Urinary bladder decompressed, unremarkable. Adrenal glands unremarkable. Stomach/Bowel: Sigmoid diverticulosis. No active diverticulitis. Normal appendix. Stomach and small bowel  decompressed, unremarkable. Vascular/Lymphatic: No evidence of aneurysm or adenopathy. Reproductive: No visible focal abnormality. Other: No free fluid or free air. Musculoskeletal: No acute bony abnormality. IMPRESSION: Bilateral nephrolithiasis. 5 mm mid right ureteral stone with mild right hydronephrosis. Left colonic diverticulosis.  No active diverticulitis. Electronically Signed   By: Rolm Baptise M.D.   On: 11/07/2018 23:40    ____________________________________________   PROCEDURES  Procedure(s) performed (including Critical Care):  Procedures   ____________________________________________   INITIAL IMPRESSION / ASSESSMENT AND PLAN / ED COURSE  As part of my medical decision  making, I reviewed the following data within the [place] notes reviewed and incorporated, Labs reviewed, Radiograph reviewed, Notes from prior ED visits and [place] Controlled Substance [place] was evaluated in Emergency Department on 11/08/2018 for the symptoms described in the history of present illness. He was evaluated in the context of the global COVID-19 pandemic, which necessitated consideration that the patient might be at risk for infection with the SARS-CoV-2 virus that causes COVID-19. Institutional protocols and algorithms that pertain to the evaluation of patients at risk for COVID-19 are in a state of rapid change based on information released by regulatory bodies including the CDC and federal and state organizations. These policies and algorithms were followed during the patient's care in the ED.    57 year old male who presents with right flank pain; history of kidney stones. Differential diagnosis includes, but is not limited to, acute appendicitis, renal colic, testicular torsion, urinary tract infection/pyelonephritis, prostatitis,  epididymitis, diverticulitis, small bowel obstruction or ileus, colitis, abdominal aortic aneurysm, gastroenteritis, hernia, etc.  Patient currently pain-free.  Sees Dr. Bernardo Heater from urology.  Will administer IV fluids, Flomax and patient will follow-up closely with urology.  Patient mentions oral Dilaudid is the best opioid medication for him as it is the least constipating.  Strict return precautions given.  Patient verbalizes understanding agrees with plan of care.      ____________________________________________   FINAL CLINICAL IMPRESSION(S) / ED DIAGNOSES  Final diagnoses:  Renal colic on right side     ED Discharge Orders         Ordered    HYDROmorphone (DILAUDID) 2 MG tablet  Every 6 hours PRN     11/08/18 0037    tamsulosin (FLOMAX) 0.4 MG CAPS capsule  Daily     11/08/18 0037     ondansetron (ZOFRAN ODT) 4 MG disintegrating tablet  Every 8 hours PRN     11/08/18 0037           Note:  This document was prepared using Dragon voice recognition software and may include unintentional dictation errors.   Paulette Blanch, MD 11/08/18 [phone]

## 2018-11-09 ENCOUNTER — Telehealth: Payer: Self-pay | Admitting: Urology

## 2018-12-11 ENCOUNTER — Telehealth: Payer: Self-pay | Admitting: Urology

## 2018-12-11 NOTE — Telephone Encounter (Signed)
Pt's appt isn't until February, do you want me to have him bring his stone in to send off for analysis?

## 2018-12-11 NOTE — Telephone Encounter (Signed)
Pt just called to let us know that he has passed his kidney and is was 48mm.

## 2018-12-12 NOTE — Telephone Encounter (Signed)
Called pt he states that he has had stone anaylsis in the past, he states he will hold onto stone until his appt in feb.

## 2019-02-11 ENCOUNTER — Ambulatory Visit: Payer: Managed Care, Other (non HMO) | Admitting: Urology

## 2019-02-22 ENCOUNTER — Ambulatory Visit: Payer: Managed Care, Other (non HMO) | Admitting: Urology

## 2019-02-25 ENCOUNTER — Ambulatory Visit
Admission: RE | Admit: 2019-02-25 | Discharge: 2019-02-25 | Disposition: A | Discharge status: Home / Self Care | Payer: Managed Care, Other (non HMO) | Source: Ambulatory Visit | Attending: Urology | Admitting: Urology

## 2019-02-25 ENCOUNTER — Other Ambulatory Visit: Payer: Self-pay | Admitting: Urology

## 2019-02-25 ENCOUNTER — Ambulatory Visit: Payer: Managed Care, Other (non HMO) | Admitting: Urology

## 2019-02-25 ENCOUNTER — Other Ambulatory Visit: Payer: Self-pay

## 2019-02-25 ENCOUNTER — Encounter: Payer: Self-pay | Admitting: Urology

## 2019-02-25 VITALS — BP 147/87 | HR 71 | Ht 72.0 in | Wt 231.0 lb

## 2019-02-25 DIAGNOSIS — N200 Calculus of kidney: Secondary | ICD-10-CM | POA: Diagnosis not present

## 2019-02-25 DIAGNOSIS — Z8546 Personal history of malignant neoplasm of prostate: Secondary | ICD-10-CM

## 2019-02-25 NOTE — Progress Notes (Signed)
02/25/2019 3:37 PM   NEKHI OEN 1960/10/10 [id number]  Referring provider: Kirk Ruths, MD [facility] [place],  [place] [address]  Chief Complaint  Patient presents with  . Nephrolithiasis    Urologic history: 1.  Personal history prostate cancer             - RALP Dr. Alinda Money 2007             -Undetectable PSA  2.  Nephrolithiasis             -Recurrent stone disease             -12 mm left renal calculus  3.  Erectile dysfunction             -Post prostatectomy  HPI: 59 yo male presents for follow-up.  He was last seen August 2020 and was not due for a follow-up until this coming August.  He was seen in the ED early November 2020 with right flank pain.  CT was performed which showed a 5 mm right mid ureteral calculus which he subsequently passed.  He was also noted to have bilateral renal calculi.  He is presently asymptomatic.  He was scheduled for penile prosthesis early March 2020 which was postponed secondary to COVID-19.  He has a history of hypogonadism.  He takes DHEA however recently ran out.   PMH: Past Medical History:  Diagnosis Date  . ED (erectile dysfunction)   . History of prostate cancer   . Kidney stone     Surgical History: Past Surgical History:  Procedure Laterality Date  . PROSTATE SURGERY    . PROSTATECTOMY  2007    Home Medications:  Allergies as of 02/25/2019   No Known Allergies     Medication List       Accurate as of February 25, 2019  3:37 PM. If you have any questions, ask your nurse or doctor.        aspirin EC 81 MG tablet Take 81 mg by mouth.   HYDROmorphone 2 MG tablet Commonly known as: Dilaudid Take 1 tablet (2 mg total) by mouth every 6 (six) hours as needed for severe pain.   MULTIVITAMIN ADULT PO Take by mouth daily.   Omega-3 1000 MG Caps Take by mouth.   ondansetron 4 MG disintegrating tablet Commonly known as: Zofran ODT Take 1 tablet (4 mg total) by mouth  every 8 (eight) hours as needed for nausea or vomiting.   pantoprazole 40 MG tablet Commonly known as: PROTONIX   tamsulosin 0.4 MG Caps capsule Commonly known as: Flomax Take 1 capsule (0.4 mg total) by mouth daily.       Allergies: No Known Allergies  Family History: No family history on file.  Social History:  reports that he has never smoked. He has never used smokeless tobacco. He reports previous alcohol use. He reports that he does not use drugs.   Physical Exam: Ht 6' (1.829 m)   Wt 231 lb (104.8 kg)   BMI 31.33 kg/m   Constitutional:  Alert and oriented, No acute distress. HEENT: [place] AT, moist mucus membranes.  Trachea midline, no masses. Cardiovascular: No clubbing, cyanosis, or edema. Respiratory: Normal respiratory effort, no increased work of breathing. Skin: No rashes, bruises or suspicious lesions. Neurologic: Grossly intact, no focal deficits, moving all 4 extremities. Psychiatric: Normal mood and affect.   Pertinent Imaging: CT and KUB reviewed.  On CT there are 2 right  lower pole renal calculi measuring approximately 4 mm and a 3 mm right upper pole calculus. Results for orders placed during the hospital encounter of 08/09/18  Abdomen 1 view (KUB)   Narrative CLINICAL DATA:  Nephrolithiasis.  EXAM: ABDOMEN - 1 VIEW  COMPARISON:  October 05, 2012.  FINDINGS: The bowel gas pattern is normal.  Bilateral nephrolithiasis. The largest calculus overlying the midpole region of the left kidney measures 1.7 cm.  Probable phleboliths in the left pelvis.  IMPRESSION: Bilateral nephrolithiasis.   Electronically Signed   By: Fidela Salisbury M.D.   On: 08/09/2018 15:30     Results for orders placed during the hospital encounter of 11/07/18  CT Renal Stone Study   Narrative CLINICAL DATA:  Hematuria.  Flank pain  EXAM: CT ABDOMEN AND PELVIS WITHOUT CONTRAST  TECHNIQUE: Multidetector CT imaging of the abdomen and pelvis was  performed following the standard protocol without IV contrast.  COMPARISON:  09/15/2009  FINDINGS: Lower chest: Lung bases are clear. No effusions. Heart is normal size.  Hepatobiliary: No focal hepatic abnormality. Gallbladder unremarkable.  Pancreas: No focal abnormality or ductal dilatation.  Spleen: No focal abnormality.  Normal size.  Adrenals/Urinary Tract: Mild right hydronephrosis. 5 mm mid right ureteral stone at the pelvic brim. Multiple bilateral renal stones, the largest in the mid to lower pole of the left kidney measuring up to 19 mm. No hydronephrosis on the left. 4.2 cm cyst posteriorly in the midpole of the left kidney. Urinary bladder decompressed, unremarkable. Adrenal glands unremarkable.  Stomach/Bowel: Sigmoid diverticulosis. No active diverticulitis. Normal appendix. Stomach and small bowel decompressed, unremarkable.  Vascular/Lymphatic: No evidence of aneurysm or adenopathy.  Reproductive: No visible focal abnormality.  Other: No free fluid or free air.  Musculoskeletal: No acute bony abnormality.  IMPRESSION: Bilateral nephrolithiasis.  5 mm mid right ureteral stone with mild right hydronephrosis.  Left colonic diverticulosis.  No active diverticulitis.   Electronically Signed   By: Rolm Baptise M.D.   On: 11/07/2018 23:40     Assessment & Plan:    - Nephrolithiasis Recently passed ureteral calculus.  Stable, large, nonobstructing left lower pole calculus.  He desires to continue surveillance and recommend 1 year follow-up with KUB.  - History prostate cancer PSA ordered  - Erectile dysfunction He requested a testosterone level.   Abbie Sons, [place] [address], [place], [place] [address] [phone]

## 2019-02-26 LAB — PSA: Prostate Specific Ag, Serum: <0.1 ng/mL (ref 0.0–4.0)

## 2019-02-26 LAB — TESTOSTERONE: Testosterone: 304 ng/dL (ref 264–916)

## 2019-02-27 ENCOUNTER — Telehealth: Payer: Self-pay | Admitting: *Deleted

## 2019-02-27 NOTE — Telephone Encounter (Signed)
Notified patient as instructed, patient pleased. Discussed follow-up appointments, patient agrees  

## 2019-02-27 NOTE — Telephone Encounter (Signed)
-----   Message from Abbie Sons, MD sent at 02/26/2019  7:58 PM EST ----- PSA was undetectable at <0.1.  Testosterone level low normal at 304

## 2019-02-28 ENCOUNTER — Encounter: Payer: Self-pay | Admitting: Urology

## 2019-03-18 ENCOUNTER — Telehealth: Payer: Self-pay

## 2019-03-18 NOTE — Telephone Encounter (Signed)
Will sent in rx if approved.

## 2019-03-18 NOTE — Telephone Encounter (Signed)
Pt returns call and states that he would like RX sent to [facility]. In case a prior Josem Kaufmann is needed he also gave me his RX benefits information.  Doreen [id number] PO:[id number] RX Group- [facility] RX policy is under his wife's name Lanelle Bal

## 2019-03-18 NOTE — Telephone Encounter (Signed)
Patient would like a 90 supply of tadalafil . He will call us back with the name of pharmacy yo send it too.

## 2019-03-18 NOTE — Telephone Encounter (Signed)
Patient called and left a vmail to speak with you

## 2019-03-20 NOTE — Telephone Encounter (Signed)
I do not see he has been on tadalafil.  What dose was he wanting?  There is a 5 mg daily dose and 10/20 mg doses that are taken 1 hour prior to intercourse.  If he is wanting the prn dosing the insurance varies on what is a 1 month supply so I would need to know how many tablets.  In my experience it is cheaper to pay out of pocket using a good Rx coupon unless he has really good insurance

## 2019-03-20 NOTE — Telephone Encounter (Signed)
Patient would like to Try Tadalafi 10/20mg  . He states he wants to take it daily. 90 tabs . He will do good rx. Marland Kitchen

## 2019-03-22 NOTE — Telephone Encounter (Signed)
The 10/20 mg tadalafil dose is not to be taken daily but to be taken 1 hour prior to intercourse.  Before getting a 90 tablet dose which runs about $50 with good Rx at [facility] he may want to get 10 tabs initially (around $8) to make sure the medication is tolerated and effective.

## 2019-03-22 NOTE — Telephone Encounter (Signed)
Patient would like to have the strongest Tadalafil that he can take daily.

## 2019-03-22 NOTE — Telephone Encounter (Signed)
error 

## 2019-04-10 MED ORDER — TADALAFIL 5 MG PO TABS: 5.0000 mg | ORAL_TABLET | Freq: Every day | ORAL | 3 refills | Status: DC

## 2019-04-10 NOTE — Telephone Encounter (Signed)
The only indicated tadalafil for daily use is the 5 mg dose.

## 2019-08-16 ENCOUNTER — Ambulatory Visit: Payer: Managed Care, Other (non HMO) | Admitting: Urology

## 2019-08-29 ENCOUNTER — Ambulatory Visit (INDEPENDENT_AMBULATORY_CARE_PROVIDER_SITE_OTHER): Payer: Managed Care, Other (non HMO) | Admitting: Dermatology

## 2019-08-29 ENCOUNTER — Other Ambulatory Visit: Payer: Self-pay

## 2019-08-29 ENCOUNTER — Encounter: Payer: Self-pay | Admitting: Dermatology

## 2019-08-29 DIAGNOSIS — L814 Other melanin hyperpigmentation: Secondary | ICD-10-CM

## 2019-08-29 DIAGNOSIS — Z1283 Encounter for screening for malignant neoplasm of skin: Secondary | ICD-10-CM | POA: Diagnosis not present

## 2019-08-29 DIAGNOSIS — D229 Melanocytic nevi, unspecified: Secondary | ICD-10-CM | POA: Diagnosis not present

## 2019-08-29 DIAGNOSIS — D2371 Other benign neoplasm of skin of right lower limb, including hip: Secondary | ICD-10-CM | POA: Diagnosis not present

## 2019-08-29 DIAGNOSIS — D2372 Other benign neoplasm of skin of left lower limb, including hip: Secondary | ICD-10-CM | POA: Diagnosis not present

## 2019-08-29 DIAGNOSIS — L821 Other seborrheic keratosis: Secondary | ICD-10-CM

## 2019-08-29 DIAGNOSIS — Z85828 Personal history of other malignant neoplasm of skin: Secondary | ICD-10-CM

## 2019-08-29 DIAGNOSIS — D1800 Hemangioma unspecified site: Secondary | ICD-10-CM

## 2019-08-29 DIAGNOSIS — L578 Other skin changes due to chronic exposure to nonionizing radiation: Secondary | ICD-10-CM

## 2019-08-29 DIAGNOSIS — Z86007 Personal history of in-situ neoplasm of skin: Secondary | ICD-10-CM

## 2019-08-29 DIAGNOSIS — D239 Other benign neoplasm of skin, unspecified: Secondary | ICD-10-CM

## 2019-08-29 NOTE — Patient Instructions (Addendum)
Recommend daily broad spectrum sunscreen SPF 30+ to sun-exposed areas, reapply every 2 hours as needed. Call for new or changing lesions. Melanoma ABCDEs  Melanoma is the most dangerous type of skin cancer, and is the leading cause of death from skin disease.  You are more likely to develop melanoma if you:  Have light-colored skin, light-colored eyes, or red or blond hair  Spend a lot of time in the sun  Tan regularly, either outdoors or in a tanning bed  Have had blistering sunburns, especially during childhood  Have a close family member who has had a melanoma  Have atypical moles or large birthmarks  Early detection of melanoma is key since treatment is typically straightforward and cure rates are extremely high if we catch it early.   The first sign of melanoma is often a change in a mole or a new dark spot.  The ABCDE system is a way of remembering the signs of melanoma.  A for asymmetry:  The two halves do not match. B for border:  The edges of the growth are irregular. C for color:  A mixture of colors are present instead of an even brown color. D for diameter:  Melanomas are usually (but not always) greater than 6mm - the size of a pencil eraser. E for evolution:  The spot keeps changing in size, shape, and color.  Please check your skin once per month between visits. You can use a small mirror in front and a large mirror behind you to keep an eye on the back side or your body.   If you see any new or changing lesions before your next follow-up, please call to schedule a visit.  Please continue daily skin protection including broad spectrum sunscreen SPF 30+ to sun-exposed areas, reapplying every 2 hours as needed when you're outdoors.   

## 2019-08-29 NOTE — Progress Notes (Signed)
   Follow-Up Visit   Subjective  Walter Hood is a 59 y.o. male who presents for the following: Follow-up full body skin exam and skin cancer screening  Patient presents today for full body skin exam and skin cancer screening. He has no concerns. 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 He also has a history of BCC.  The following portions of the chart were reviewed this encounter and updated as appropriate:  Tobacco  Allergies  Meds  Problems  Med Hx  Surg Hx  Fam Hx      Review of Systems:  No other skin or systemic complaints except as noted in HPI or Assessment and Plan.  Objective  Well appearing patient in no apparent distress; mood and affect are within normal limits.  A full examination was performed including scalp, head, eyes, ears, nose, lips, neck, chest, axillae, abdomen, back, buttocks, bilateral upper extremities, bilateral lower extremities, hands, feet, fingers, toes, fingernails, and toenails. All findings within normal limits unless otherwise noted below.  Objective  Left Lower Leg - Anterior, Right Thigh - Anterior: Firm pink/brown papulenodule with dimple sign.    Assessment & Plan  Dermatofibroma (2) Right Thigh - Anterior; Left Lower Leg - Anterior  Benign-appearing.  Observation.  Call clinic for new or changing lesions.  Recommend daily use of broad spectrum spf 30+ sunscreen to sun-exposed areas.   Lentigines - Scattered tan macules - Discussed due to sun exposure - Benign, observe - Call for any changes  Seborrheic Keratoses - Stuck-on, waxy, tan-brown papules and plaques  - Discussed benign etiology and prognosis. - Observe - Call for any changes  Melanocytic Nevi - Tan-brown and/or pink-flesh-colored symmetric macules and papules - Benign appearing on exam today - Observation - Call clinic for new or changing moles - Recommend daily use of broad spectrum spf 30+ sunscreen to sun-exposed areas.    Hemangiomas - Red papules - Discussed benign nature - Observe - Call for any changes  Actinic Damage - diffuse scaly erythematous macules with underlying dyspigmentation - Recommend daily broad spectrum sunscreen SPF 30+ to sun-exposed areas, reapply every 2 hours as needed.  - Call for new or changing lesions.  Skin cancer screening performed today.   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  - Call if any new or changing lesions are noted between office visits  History of Basal Cell Carcinoma of the Skin - No evidence of recurrence today - Recommend regular full body skin exams - Recommend daily broad spectrum sunscreen SPF 30+ to sun-exposed areas, reapply every 2 hours as needed.  - Call if any new or changing lesions are noted between office visits   Return in about 1 year (around 08/28/2020) for TBSE.  Offered a q6 month exam but patient is comfortable monitoring at home for new or changing lesions.    IDonzetta Kohut, CMA, am acting as scribe for Forest Gleason, MD .  Documentation: I have reviewed the above documentation for accuracy and completeness, and I agree with the above.  Forest Gleason, MD

## 2019-09-03 ENCOUNTER — Encounter: Payer: Self-pay | Admitting: Dermatology

## 2019-09-17 DIAGNOSIS — M79676 Pain in unspecified toe(s): Secondary | ICD-10-CM

## 2019-09-25 ENCOUNTER — Telehealth: Payer: Self-pay | Admitting: Sports Medicine

## 2019-09-25 NOTE — Telephone Encounter (Signed)
Medical records faxed to [employer] on 09/25/19

## 2019-11-12 ENCOUNTER — Ambulatory Visit: Payer: Managed Care, Other (non HMO) | Admitting: Dermatology

## 2019-11-12 ENCOUNTER — Other Ambulatory Visit: Payer: Self-pay

## 2019-11-12 ENCOUNTER — Encounter: Payer: Self-pay | Admitting: Dermatology

## 2019-11-12 DIAGNOSIS — Z85828 Personal history of other malignant neoplasm of skin: Secondary | ICD-10-CM | POA: Diagnosis not present

## 2019-11-12 DIAGNOSIS — Z86007 Personal history of in-situ neoplasm of skin: Secondary | ICD-10-CM

## 2019-11-12 DIAGNOSIS — L570 Actinic keratosis: Secondary | ICD-10-CM

## 2019-11-12 DIAGNOSIS — L578 Other skin changes due to chronic exposure to nonionizing radiation: Secondary | ICD-10-CM

## 2019-11-12 DIAGNOSIS — D235 Other benign neoplasm of skin of trunk: Secondary | ICD-10-CM

## 2019-11-12 DIAGNOSIS — L814 Other melanin hyperpigmentation: Secondary | ICD-10-CM | POA: Diagnosis not present

## 2019-11-12 DIAGNOSIS — L821 Other seborrheic keratosis: Secondary | ICD-10-CM

## 2019-11-12 DIAGNOSIS — D1800 Hemangioma unspecified site: Secondary | ICD-10-CM

## 2019-11-12 NOTE — Patient Instructions (Signed)

## 2019-11-12 NOTE — Progress Notes (Signed)
   Follow-Up Visit   Subjective  Walter Hood is a 59 y.o. male who presents for the following: Rough spot (scalp x few months), Growth (back, noticed a few mos ago), and brown spot (right cheek, hx of change).  Gets darker and lighter off and on, no change in size or shape.   The following portions of the chart were reviewed this encounter and updated as appropriate:      Review of Systems:  No other skin or systemic complaints except as noted in HPI or Assessment and Plan.  Objective  Well appearing patient in no apparent distress; mood and affect are within normal limits.  A focused examination was performed including scalp, back. Relevant physical exam findings are noted in the Assessment and Plan.  Objective  Posterior crown x 1: Pink/brown scaly flat papule   Objective  Right Medial Cheek: 2.81mm brown macule, slightly waxy, inferior to scar of previous BCC excision site  Images       Assessment & Plan   Actinic Damage - chronic, secondary to cumulative UV radiation exposure/sun exposure over time - diffuse scaly erythematous macules with underlying dyspigmentation - Recommend daily broad spectrum sunscreen SPF 30+ to sun-exposed areas, reapply every 2 hours as needed.  - Call for new or changing lesions.  History of Squamous Cell Carcinoma in Situ of the Skin - No evidence of recurrence today of the vertex scalp - Recommend regular full body skin exams - Recommend daily broad spectrum sunscreen SPF 30+ to sun-exposed areas, reapply every 2 hours as needed.  - Call if any new or changing lesions are noted between office visits  Hemangiomas - Red papules - Discussed benign nature - Observe - Call for any changes  Dermatofibroma - Firm pink/brown papulenodule with dimple sign on right mid back - Benign appearing - Call for any changes  Seborrheic Keratoses - Stuck-on, waxy, tan-brown papules and plaques, including right flank  - Discussed benign etiology  and prognosis. - Observe - Call for any changes  History of Basal Cell Carcinoma of the Skin - No evidence of recurrence today R malar cheek - Recommend regular full body skin exams - Recommend daily broad spectrum sunscreen SPF 30+ to sun-exposed areas, reapply every 2 hours as needed.  - Call if any new or changing lesions are noted between office visits    AK (actinic keratosis) Posterior crown x 1  AK vs ISK  Destruction of lesion - Posterior crown x 1  Destruction method: cryotherapy   Informed consent: discussed and consent obtained   Lesion destroyed using liquid nitrogen: Yes   Region frozen until ice ball extended beyond lesion: Yes   Outcome: patient tolerated procedure well with no complications   Post-procedure details: wound care instructions given    Lentigines Right Medial Cheek  Vs SK  Benign-appearing.  Observation.  Call clinic for new or changing moles.  Recommend daily use of broad spectrum spf 30+ sunscreen to sun-exposed areas.    Return as scheduled for skin check with Dr. Laurence Ferrari.   Documentation: I have reviewed the above documentation for accuracy and completeness, and I agree with the above.  Brendolyn Patty MD

## 2020-01-28 ENCOUNTER — Other Ambulatory Visit: Payer: Self-pay | Admitting: Urology

## 2020-02-24 ENCOUNTER — Telehealth: Payer: Self-pay | Admitting: *Deleted

## 2020-02-24 DIAGNOSIS — N200 Calculus of kidney: Secondary | ICD-10-CM

## 2020-02-24 DIAGNOSIS — Z8546 Personal history of malignant neoplasm of prostate: Secondary | ICD-10-CM

## 2020-02-24 NOTE — Telephone Encounter (Signed)
Pt. needs PSA sent to the [facility], because his wife works at [employer] and the test would be free. If you need to call him [phone]. All testing for labs should be sent to this facility. Per Tammy    Made appt for lab . JQ

## 2020-02-25 ENCOUNTER — Other Ambulatory Visit: Payer: Managed Care, Other (non HMO)

## 2020-02-25 ENCOUNTER — Other Ambulatory Visit: Payer: Self-pay

## 2020-02-25 DIAGNOSIS — Z8546 Personal history of malignant neoplasm of prostate: Secondary | ICD-10-CM

## 2020-02-26 LAB — PSA: Prostate Specific Ag, Serum: <0.1 ng/mL (ref 0.0–4.0)

## 2020-02-26 LAB — TESTOSTERONE: Testosterone: 196 ng/dL — ABNORMAL LOW (ref 264–916)

## 2020-02-27 ENCOUNTER — Encounter: Payer: Self-pay | Admitting: Urology

## 2020-02-27 ENCOUNTER — Ambulatory Visit
Admission: RE | Admit: 2020-02-27 | Discharge: 2020-02-27 | Disposition: A | Discharge status: Home / Self Care | Payer: Managed Care, Other (non HMO) | Source: Ambulatory Visit | Attending: Urology | Admitting: Urology

## 2020-02-27 ENCOUNTER — Other Ambulatory Visit: Payer: Self-pay

## 2020-02-27 ENCOUNTER — Ambulatory Visit: Payer: Managed Care, Other (non HMO) | Admitting: Urology

## 2020-02-27 VITALS — BP 159/51 | HR 90 | Ht 72.0 in | Wt 229.0 lb

## 2020-02-27 DIAGNOSIS — E291 Testicular hypofunction: Secondary | ICD-10-CM

## 2020-02-27 DIAGNOSIS — Z8546 Personal history of malignant neoplasm of prostate: Secondary | ICD-10-CM | POA: Diagnosis not present

## 2020-02-27 DIAGNOSIS — N200 Calculus of kidney: Secondary | ICD-10-CM | POA: Insufficient documentation

## 2020-02-27 MED ORDER — TADALAFIL 5 MG PO TABS: 5.0000 mg | ORAL_TABLET | Freq: Every day | ORAL | 3 refills | Status: DC

## 2020-02-27 NOTE — Progress Notes (Unsigned)
02/27/2020 3:09 PM   Walter Hood Dec 31, 1960 [id number]  Referring provider: Kirk Ruths, MD [facility] [place],  [place] [address]  Chief Complaint  Patient presents with  . Nephrolithiasis    Urologic history: 1.Personal history prostate cancer - RALPDr. Alinda Money 2007 -Undetectable PSA  2.Nephrolithiasis -Recurrent stone disease -12 mm left renal calculus  3.Erectile dysfunction -Post prostatectomy  HPI: 60 y.o. male presents for annual follow-up.   No flank or abdominal pain since last years visit though did pass and ~ 7 mm calculus  Denies dysuria, gross hematuria  No flank, abdominal or pelvic pain  Labs 02/25/2020: PSA <0.1; Testosterone 196   PMH: Past Medical History:  Diagnosis Date  . Basal cell carcinoma   . ED (erectile dysfunction)   . History of prostate cancer   . Kidney stone   . Squamous cell carcinoma of skin 12/25/18 MOHs   Vertex Scalp, SCCIS    Surgical History: Past Surgical History:  Procedure Laterality Date  . PROSTATE SURGERY    . PROSTATECTOMY  2007    Home Medications:  Allergies as of 02/27/2020   No Known Allergies     Medication List       Accurate as of February 27, 2020  3:09 PM. If you have any questions, ask your nurse or doctor.        aspirin EC 81 MG tablet Take 81 mg by mouth.   HYDROmorphone 2 MG tablet Commonly known as: Dilaudid Take 1 tablet (2 mg total) by mouth every 6 (six) hours as needed for severe pain.   MULTIVITAMIN ADULT PO Take by mouth daily.   Omega-3 1000 MG Caps Take by mouth.   ondansetron 4 MG disintegrating tablet Commonly known as: Zofran ODT Take 1 tablet (4 mg total) by mouth every 8 (eight) hours as needed for nausea or vomiting.   pantoprazole 40 MG tablet Commonly known as: PROTONIX   tadalafil 5 MG tablet Commonly known as: CIALIS TAKE 1 TABLET BY MOUTH   DAILY   tamsulosin 0.4 MG Caps capsule Commonly known as: Flomax Take 1 capsule (0.4 mg total) by mouth daily.       Allergies: No Known Allergies  Family History: History reviewed. No pertinent family history.  Social History:  reports that he has never smoked. He has never used smokeless tobacco. He reports previous alcohol use. He reports that he does not use drugs.   Physical Exam: BP (!) 159/51   Pulse 90   Ht 6' (1.829 m)   Wt 229 lb (103.9 kg)   BMI 31.06 kg/m   Constitutional:  Alert and oriented, No acute distress. HEENT: [place] AT, moist mucus membranes.  Trachea midline, no masses. Cardiovascular: No clubbing, cyanosis, or edema. Respiratory: Normal respiratory effort, no increased work of breathing. Skin: No rashes, bruises or suspicious lesions. Neurologic: Grossly intact, no focal deficits, moving all 4 extremities. Psychiatric: Normal mood and affect.    Assessment & Plan:    1.  Nephrolithiasis  KUB ordered and he will be notified of the results  2.  History prostate cancer  PSA remains undetectable  3.  Hypogonadism  Prior testosterone results have been low normal  He is far enough out from his prostate surgery where TRT could be started with close monitoring  He would like to talk this over with his wife  Continue annual follow-up   Abbie Sons, [facility] [address]  [address], [place], [place] [address] [phone]

## 2020-02-28 ENCOUNTER — Telehealth: Payer: Self-pay

## 2020-02-28 ENCOUNTER — Encounter: Payer: Self-pay | Admitting: Urology

## 2020-02-28 MED ORDER — TADALAFIL 5 MG PO TABS: 5.0000 mg | ORAL_TABLET | Freq: Every day | ORAL | 3 refills | Status: DC

## 2020-02-28 NOTE — Telephone Encounter (Signed)
Pt called asking for [facility] rx sent pt aware.

## 2020-03-02 ENCOUNTER — Telehealth: Payer: Self-pay | Admitting: *Deleted

## 2020-03-02 NOTE — Telephone Encounter (Signed)
Notified patient as instructed,Patient will see [place] next year.

## 2020-03-02 NOTE — Telephone Encounter (Signed)
-----   Message from Abbie Sons, MD sent at 02/29/2020 11:02 AM EST ----- KUB shows 2 stones in the left kidney with some increased size since last year's KUB.  He may want to consider treatment if they continue to increase in size

## 2020-06-01 IMAGING — CT CT RENAL STONE PROTOCOL
2 of 4 series · 16 of 46 positions shown, 18 images · non-contrast
Comparison: 09/15/2009

CLINICAL DATA: Hematuria.  Flank pain

EXAM:
CT ABDOMEN AND PELVIS WITHOUT CONTRAST
TECHNIQUE: Multidetector CT imaging of the abdomen and pelvis was performed
following the standard protocol without IV contrast.

[Series 2: stone full standard · axial · 0.86mm/px · z∈[-536,-46]mm · 13 of 108 slices shown, 15 images]
[im 5/108  soft-tissue]
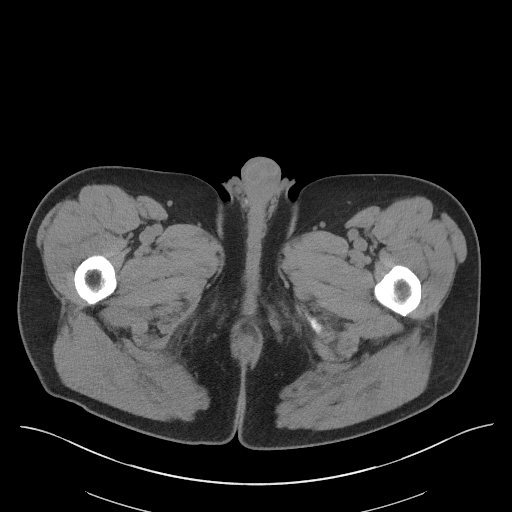
[im 5/108  bone]
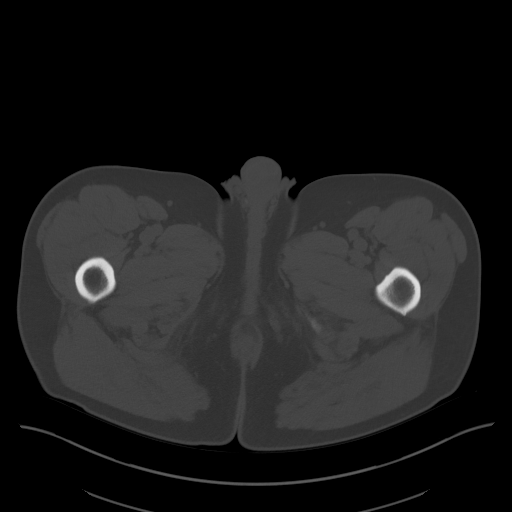
[im 13/108  soft-tissue]
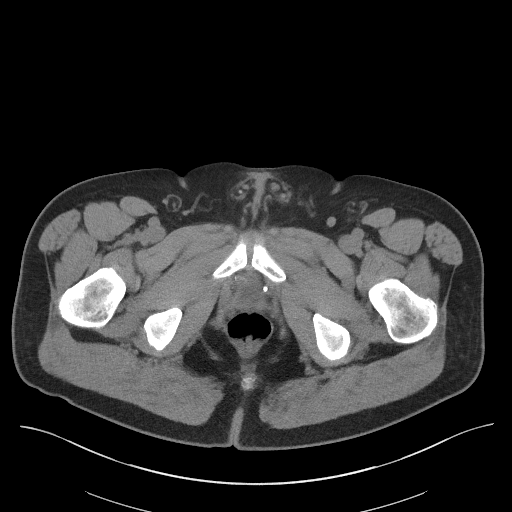
[im 22/108  soft-tissue]
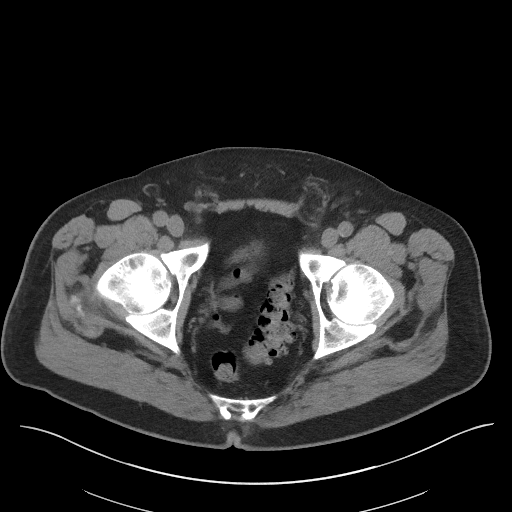
[im 30/108  soft-tissue]
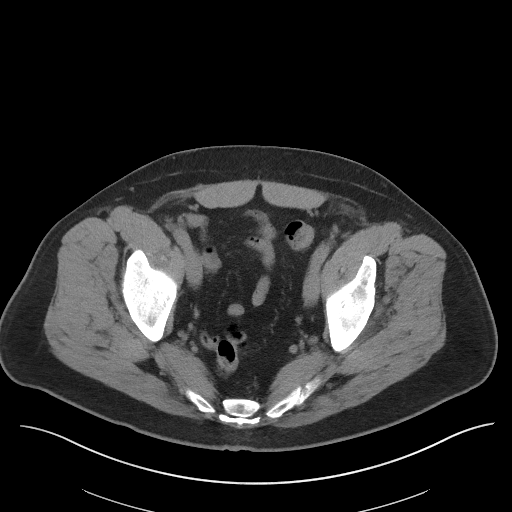
[im 39/108  soft-tissue]
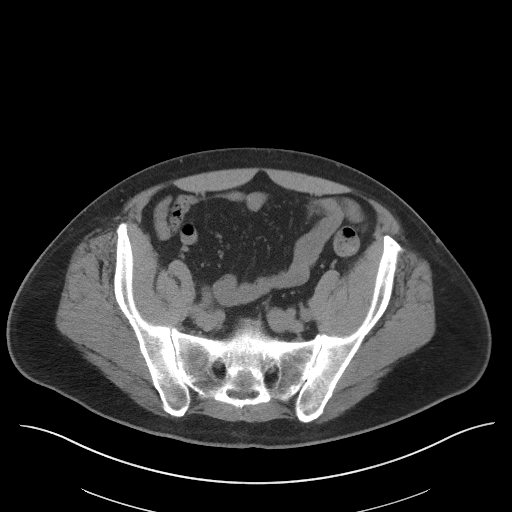
[im 48/108  soft-tissue]
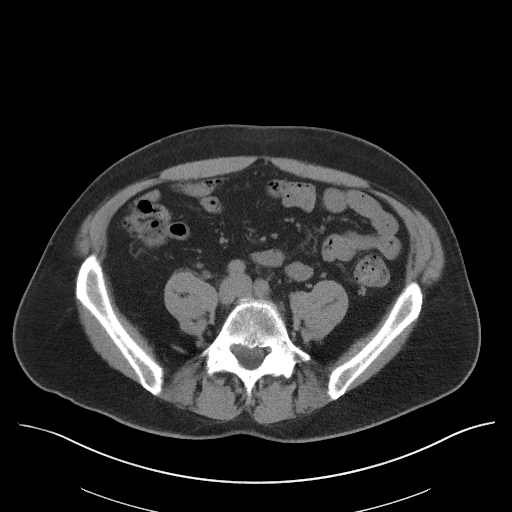
[im 56/108  soft-tissue]
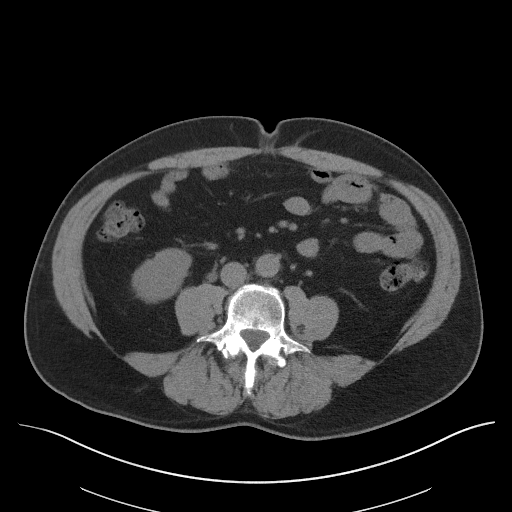
[im 60/108  soft-tissue]
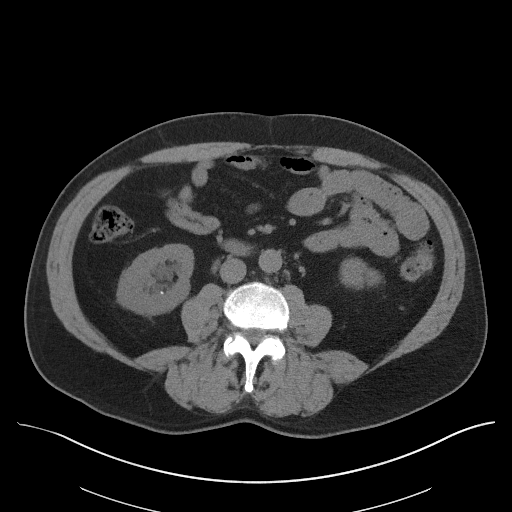
[im 69/108  soft-tissue]
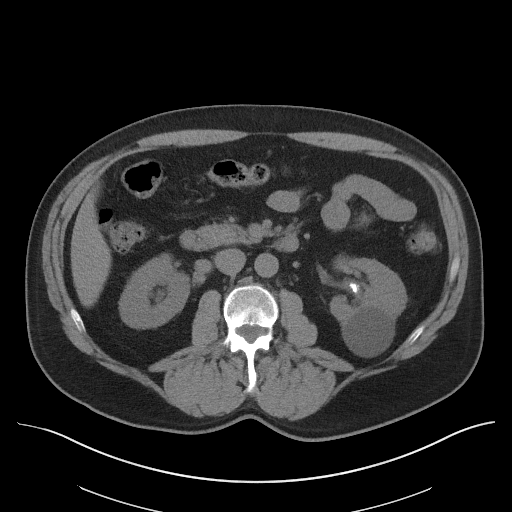
[im 69/108  bone]
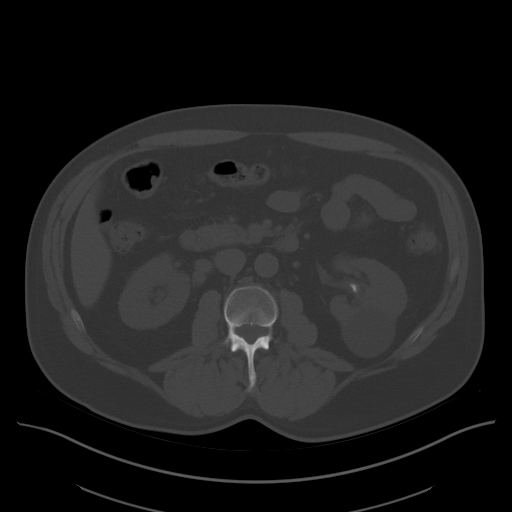
[im 78/108  soft-tissue]
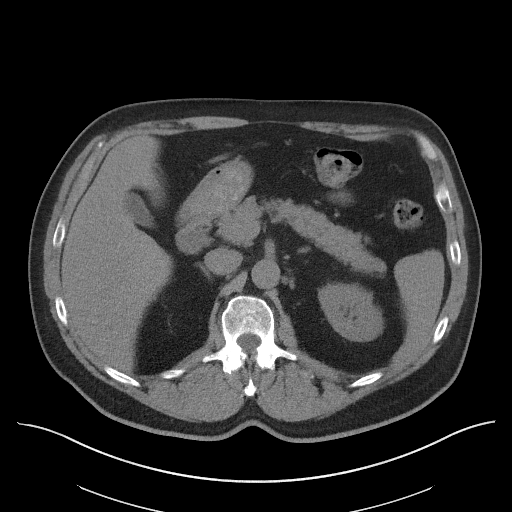
[im 86/108  soft-tissue]
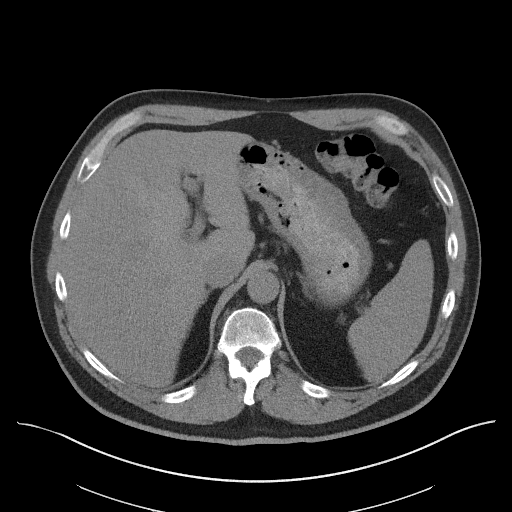
[im 95/108  soft-tissue]
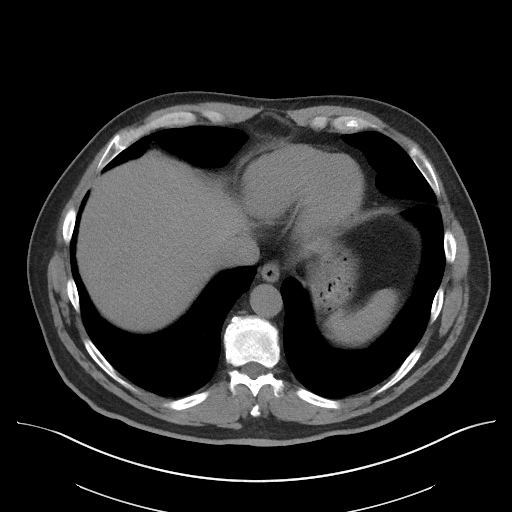
[im 103/108  soft-tissue]
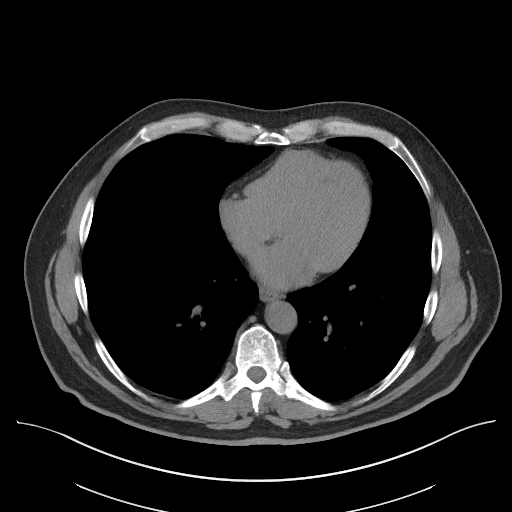

[Series 5: coronal · coronal · 0.85mm/px · 3 of 147 slices shown]
[im 49/147  soft-tissue]
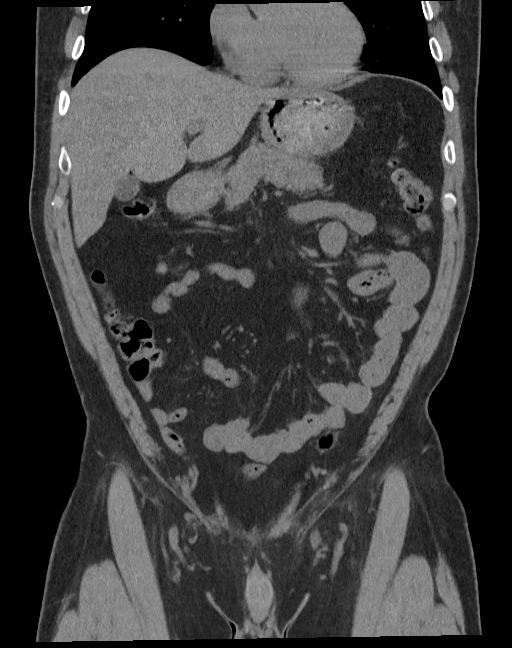
[im 65/147  soft-tissue]
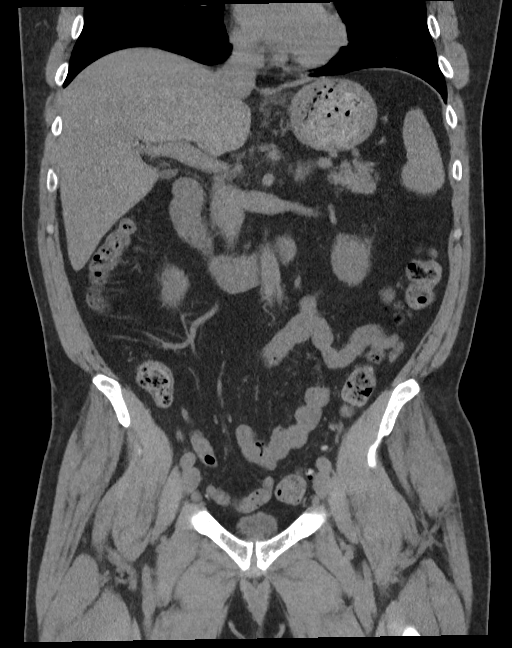
[im 82/147  soft-tissue]
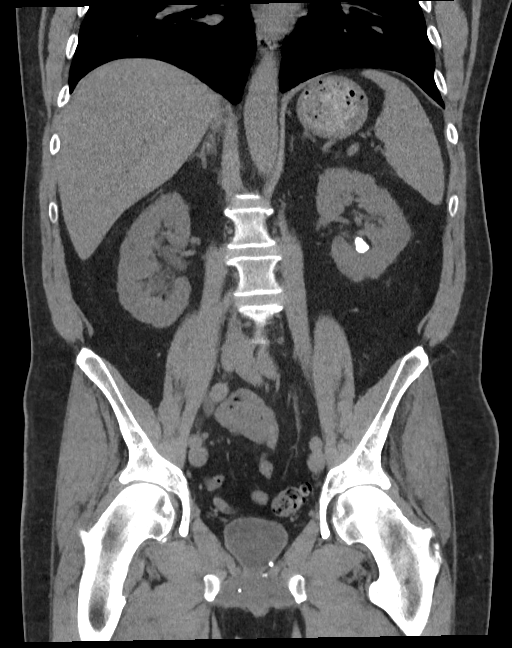

[16 of 46 positions shown; findings below may reference images not displayed]

FINDINGS: Lower chest: Lung bases are clear. No effusions. Heart is normal
size.

Hepatobiliary: No focal hepatic abnormality. Gallbladder
unremarkable.

Pancreas: No focal abnormality or ductal dilatation.

Spleen: No focal abnormality.  Normal size.

Adrenals/Urinary Tract: Mild right hydronephrosis. 5 mm mid right
ureteral stone at the pelvic brim. Multiple bilateral renal stones,
the largest in the mid to lower pole of the left kidney measuring up
to 19 mm. No hydronephrosis on the left. 4.2 cm cyst posteriorly in
the midpole of the left kidney. Urinary bladder decompressed,
unremarkable. Adrenal glands unremarkable.

Stomach/Bowel: Sigmoid diverticulosis. No active diverticulitis.
Normal appendix. Stomach and small bowel decompressed, unremarkable.

Vascular/Lymphatic: No evidence of aneurysm or adenopathy.

Reproductive: No visible focal abnormality.

Other: No free fluid or free air.

Musculoskeletal: No acute bony abnormality.
IMPRESSION: Bilateral nephrolithiasis.

5 mm mid right ureteral stone with mild right hydronephrosis.

Left colonic diverticulosis.  No active diverticulitis.

## 2020-09-02 ENCOUNTER — Encounter: Payer: Self-pay | Admitting: Dermatology

## 2020-09-02 ENCOUNTER — Other Ambulatory Visit: Payer: Self-pay

## 2020-09-02 ENCOUNTER — Ambulatory Visit (INDEPENDENT_AMBULATORY_CARE_PROVIDER_SITE_OTHER): Payer: Managed Care, Other (non HMO) | Admitting: Dermatology

## 2020-09-02 DIAGNOSIS — D235 Other benign neoplasm of skin of trunk: Secondary | ICD-10-CM

## 2020-09-02 DIAGNOSIS — Z1283 Encounter for screening for malignant neoplasm of skin: Secondary | ICD-10-CM

## 2020-09-02 DIAGNOSIS — L570 Actinic keratosis: Secondary | ICD-10-CM

## 2020-09-02 DIAGNOSIS — L720 Epidermal cyst: Secondary | ICD-10-CM

## 2020-09-02 DIAGNOSIS — L578 Other skin changes due to chronic exposure to nonionizing radiation: Secondary | ICD-10-CM

## 2020-09-02 DIAGNOSIS — Z85828 Personal history of other malignant neoplasm of skin: Secondary | ICD-10-CM | POA: Diagnosis not present

## 2020-09-02 DIAGNOSIS — D229 Melanocytic nevi, unspecified: Secondary | ICD-10-CM

## 2020-09-02 DIAGNOSIS — D1800 Hemangioma unspecified site: Secondary | ICD-10-CM

## 2020-09-02 DIAGNOSIS — L814 Other melanin hyperpigmentation: Secondary | ICD-10-CM

## 2020-09-02 DIAGNOSIS — D239 Other benign neoplasm of skin, unspecified: Secondary | ICD-10-CM

## 2020-09-02 DIAGNOSIS — L821 Other seborrheic keratosis: Secondary | ICD-10-CM

## 2020-09-02 NOTE — Progress Notes (Signed)
Follow-Up Visit   Subjective  Walter Hood is a 60 y.o. male who presents for the following: Annual Exam (Hx SCC, AK's). The patient presents for Total-Body Skin Exam (TBSE) for skin cancer screening and mole check.  The following portions of the chart were reviewed this encounter and updated as appropriate:   Tobacco  Allergies  Meds  Problems  Med Hx  Surg Hx  Fam Hx     Review of Systems:  No other skin or systemic complaints except as noted in HPI or Assessment and Plan.  Objective  Well appearing patient in no apparent distress; mood and affect are within normal limits.  A full examination was performed including scalp, head, eyes, ears, nose, lips, neck, chest, axillae, abdomen, back, buttocks, bilateral upper extremities, bilateral lower extremities, hands, feet, fingers, toes, fingernails, and toenails. All findings within normal limits unless otherwise noted below.  L frontal scalp x 1 Erythematous thin papules/macules with gritty scale.   L forehead Small firm papule with central punctum  R mid back Firm pink/brown papulenodule with dimple sign.    Assessment & Plan  AK (actinic keratosis) L frontal scalp x 1  Prior to procedure, discussed risks of blister formation, small wound, skin dyspigmentation, or rare scar following cryotherapy. Recommend Vaseline ointment to treated areas while healing.   Destruction of lesion - L frontal scalp x 1 Complexity: simple   Destruction method: cryotherapy   Informed consent: discussed and consent obtained   Timeout:  patient name, date of birth, surgical site, and procedure verified Lesion destroyed using liquid nitrogen: Yes   Region frozen until ice ball extended beyond lesion: Yes   Outcome: patient tolerated procedure well with no complications   Post-procedure details: wound care instructions given    Epidermal inclusion cyst L forehead  Very small and superficial, benign-appearing. Exam most consistent with  an epidermal inclusion cyst. Discussed that a cyst is a benign growth that can grow over time and sometimes get irritated or inflamed. Recommend observation if it is not bothersome. Discussed option of surgical excision to remove it if it is growing, symptomatic, or other changes noted. Please call for new or changing lesions so they can be evaluated.    Dermatofibroma R mid back  Benign growth possibly related to trauma, such as an insect bite.  Discussed removal (shave vrs excision), resulting scar and risk of recurrence. Since not bothersome, will observe for now.  Lentigines - Scattered tan macules - Due to sun exposure - Benign-appering, observe - Recommend daily broad spectrum sunscreen SPF 30+ to sun-exposed areas, reapply every 2 hours as needed. - Call for any changes  Seborrheic Keratoses - Stuck-on, waxy, tan-brown papules and/or plaques  - Benign-appearing - Discussed benign etiology and prognosis. - Observe - Call for any changes  Melanocytic Nevi - Tan-brown and/or pink-flesh-colored symmetric macules and papules - Benign appearing on exam today - Observation - Call clinic for new or changing moles - Recommend daily use of broad spectrum spf 30+ sunscreen to sun-exposed areas.   Hemangiomas - Red papules - Discussed benign nature - Observe - Call for any changes  Actinic Damage - Chronic condition, secondary to cumulative UV/sun exposure - diffuse scaly erythematous macules with underlying dyspigmentation - Recommend daily broad spectrum sunscreen SPF 30+ to sun-exposed areas, reapply every 2 hours as needed.  - Staying in the shade or wearing long sleeves, sun glasses (UVA+UVB protection) and wide brim hats (4-inch brim around the entire circumference of the hat)  are also recommended for sun protection.  - Call for new or changing lesions.  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  - Call if any new or changing lesions are noted between office visits  Skin cancer screening performed today.  Return in about 1 year (around 09/02/2021) for TBSE - hx SCC, AK's .  Luther Redo, CMA, am acting as scribe for Forest Gleason, MD .  Documentation: I have reviewed the above documentation for accuracy and completeness, and I agree with the above.  Forest Gleason, MD

## 2020-09-02 NOTE — Patient Instructions (Addendum)
If you have any questions or concerns for your doctor, please call our main line at [phone] and press option 4 to reach your doctor's medical assistant. If no one answers, please leave a voicemail as directed and we will return your call as soon as possible. Messages left after 4 pm will be answered the following business day.   You may also send us a message via MyChart. We typically respond to MyChart messages within 1-2 business days.  For prescription refills, please ask your pharmacy to contact our office. Our fax number is [phone].  If you have an urgent issue when the clinic is closed that cannot wait until the next business day, you can page your doctor at the number below.    Please note that while we do our best to be available for urgent issues outside of office hours, we are not available 24/7.   If you have an urgent issue and are unable to reach us, you may choose to seek medical care at your doctor's office, retail clinic, urgent care center, or emergency room.  If you have a medical emergency, please immediately call 911 or go to the emergency department.  Pager Numbers  - Dr. Kowalski: [phone]  - Dr. Moye: [phone]  - Dr. Stewart: [phone]  In the event of inclement weather, please call our main line at [phone] for an update on the status of any delays or closures.  Dermatology Medication Tips: Please keep the boxes that topical medications come in in order to help keep track of the instructions about where and how to use these. Pharmacies typically print the medication instructions only on the boxes and not directly on the medication tubes.   If your medication is too expensive, please contact our office at [phone] option 4 or send us a message through MyChart.   We are unable to tell what your co-pay for medications will be in advance as this is different depending on your insurance coverage. However, we may be able to find a substitute  medication at lower cost or fill out paperwork to get insurance to cover a needed medication.   If a prior authorization is required to get your medication covered by your insurance company, please allow us 1-2 business days to complete this process.  Drug prices often vary depending on where the prescription is filled and some pharmacies may offer cheaper prices.  The website www.goodrx.com contains coupons for medications through different pharmacies. The prices here do not account for what the cost may be with help from insurance (it may be cheaper with your insurance), but the website can give you the price if you did not use any insurance.  - You can print the associated coupon and take it with your prescription to the pharmacy.  - You may also stop by our office during regular business hours and pick up a GoodRx coupon card.  - If you need your prescription sent electronically to a different pharmacy, notify our office through [facility] MyChart or by phone at [phone] option 4.  Recommend taking Heliocare sun protection supplement daily in sunny weather for additional sun protection. For maximum protection on the sunniest days, you can take up to 2 capsules of regular Heliocare OR take 1 capsule of Heliocare Ultra. For prolonged exposure (such as a full day in the sun), you can repeat your dose of the supplement 4 hours after your first dose. Heliocare can be purchased at [facility] or at www.heliocare.com.    

## 2021-02-22 ENCOUNTER — Ambulatory Visit: Payer: Managed Care, Other (non HMO) | Admitting: Urology

## 2021-02-22 ENCOUNTER — Encounter: Payer: Self-pay | Admitting: Urology

## 2021-02-22 ENCOUNTER — Ambulatory Visit
Admission: RE | Admit: 2021-02-22 | Discharge: 2021-02-22 | Disposition: A | Discharge status: Home / Self Care | Payer: Managed Care, Other (non HMO) | Attending: Urology | Admitting: Urology

## 2021-02-22 ENCOUNTER — Other Ambulatory Visit: Payer: Self-pay

## 2021-02-22 ENCOUNTER — Ambulatory Visit
Admission: RE | Admit: 2021-02-22 | Discharge: 2021-02-22 | Disposition: A | Discharge status: Home / Self Care | Payer: Managed Care, Other (non HMO) | Source: Ambulatory Visit | Attending: Urology | Admitting: Urology

## 2021-02-22 VITALS — BP 163/95 | HR 80 | Ht 72.0 in | Wt 226.0 lb

## 2021-02-22 DIAGNOSIS — N200 Calculus of kidney: Secondary | ICD-10-CM

## 2021-02-22 DIAGNOSIS — Z8546 Personal history of malignant neoplasm of prostate: Secondary | ICD-10-CM

## 2021-02-22 NOTE — Progress Notes (Signed)
02/22/2021 8:47 AM   Walter Hood 03/16/1960 [id number]  Referring provider: Kirk Ruths, MD [facility] [place],  [place] [address]  Chief Complaint  Patient presents with   Nephrolithiasis    Urologic history: 1.  Personal history prostate cancer             - RALP Dr. Alinda Money 2007             -Undetectable PSA   2.  Nephrolithiasis             -Recurrent stone disease             -12 mm left renal calculus   3.  Erectile dysfunction             -Post prostatectomy  HPI: 61 y.o. male presents for annual follow-up.  No flank or abdominal pain since last years visit  Denies dysuria, gross hematuria No flank, abdominal or pelvic pain Prior history hypogonadism and may be interested in restarting TRT    PMH: Past Medical History:  Diagnosis Date   Basal cell carcinoma    ED (erectile dysfunction)    History of prostate cancer    Kidney stone    Squamous cell carcinoma of skin 12/25/18 MOHs   Vertex Scalp, SCCIS    Surgical History: Past Surgical History:  Procedure Laterality Date   PROSTATE SURGERY     PROSTATECTOMY  2007    Home Medications:  Allergies as of 02/22/2021   No Known Allergies      Medication List        Accurate as of February 22, 2021  8:47 AM. If you have any questions, ask your nurse or doctor.          STOP taking these medications    chlorzoxazone 500 MG tablet Commonly known as: PARAFON Stopped by: Abbie Sons, MD   Omega-3 1000 MG Caps Stopped by: Abbie Sons, MD   ondansetron 4 MG disintegrating tablet Commonly known as: Zofran ODT Stopped by: Abbie Sons, MD       TAKE these medications    aspirin EC 81 MG tablet Take 81 mg by mouth.   HYDROmorphone 2 MG tablet Commonly known as: Dilaudid Take 1 tablet (2 mg total) by mouth every 6 (six) hours as needed for severe pain.   meloxicam 15 MG tablet Commonly known as: MOBIC Take 15 mg by mouth daily.    MULTIVITAMIN ADULT PO Take by mouth daily.   pantoprazole 40 MG tablet Commonly known as: PROTONIX   tadalafil 5 MG tablet Commonly known as: CIALIS Take 1 tablet (5 mg total) by mouth daily.   tiZANidine 4 MG tablet Commonly known as: ZANAFLEX Take by mouth.        Allergies: No Known Allergies  Family History: History reviewed. No pertinent family history.  Social History:  reports that he has never smoked. He has never used smokeless tobacco. He reports that he does not currently use alcohol. He reports that he does not use drugs.   Physical Exam: BP (!) 163/95    Pulse 80    Ht 6' (1.829 m)    Wt 226 lb (102.5 kg)    BMI 30.65 kg/m   Constitutional:  Alert and oriented, No acute distress. HEENT: [place] AT, moist mucus membranes.  Trachea midline, no masses. Cardiovascular: No clubbing, cyanosis, or edema. Respiratory: Normal respiratory effort, no increased work of breathing. Skin: No  rashes, bruises or suspicious lesions. Neurologic: Grossly intact, no focal deficits, moving all 4 extremities. Psychiatric: Normal mood and affect.   Assessment & Plan:    1.  Nephrolithiasis KUB ordered and he will be notified of the results  2.  History prostate cancer PSA drawn today  3.  Hypogonadism Prior testosterone results have been low normal He is far enough out from his prostate surgery where TRT could be started with close monitoring Testosterone level ordered  Continue annual follow-up   Abbie Sons, MD  [facility] [address] [place],  [address] [phone]

## 2021-02-23 ENCOUNTER — Encounter: Payer: Self-pay | Admitting: *Deleted

## 2021-02-23 ENCOUNTER — Telehealth: Payer: Self-pay | Admitting: *Deleted

## 2021-02-23 ENCOUNTER — Encounter: Payer: Self-pay | Admitting: Urology

## 2021-02-23 LAB — PSA: Prostate Specific Ag, Serum: <0.1 ng/mL (ref 0.0–4.0)

## 2021-02-23 LAB — TESTOSTERONE: Testosterone: 320 ng/dL (ref 264–916)

## 2021-02-23 NOTE — Telephone Encounter (Signed)
Patient is asking if he can do injections. He don't want to do the gel and the pills are on back order for testosterone .

## 2021-02-24 NOTE — Telephone Encounter (Signed)
He will need to have 2 testosterone levels <300 before we can give injections.  He had one last year which was 196.  Recommend scheduling repeat a.m. lab visit in approximately 2 weeks

## 2021-02-24 NOTE — Telephone Encounter (Signed)
Notified patient as instructed, patient is set up for lab  in two week. Than he would like to schedule a teaching to give himself injections.

## 2021-02-26 ENCOUNTER — Ambulatory Visit: Payer: Self-pay | Admitting: Urology

## 2021-03-04 ENCOUNTER — Encounter: Payer: Self-pay | Admitting: Dermatology

## 2021-03-04 ENCOUNTER — Ambulatory Visit: Payer: Managed Care, Other (non HMO) | Admitting: Dermatology

## 2021-03-04 ENCOUNTER — Other Ambulatory Visit: Payer: Self-pay

## 2021-03-04 DIAGNOSIS — D171 Benign lipomatous neoplasm of skin and subcutaneous tissue of trunk: Secondary | ICD-10-CM

## 2021-03-04 DIAGNOSIS — Z85828 Personal history of other malignant neoplasm of skin: Secondary | ICD-10-CM

## 2021-03-04 DIAGNOSIS — H0261 Xanthelasma of right upper eyelid: Secondary | ICD-10-CM

## 2021-03-04 DIAGNOSIS — D235 Other benign neoplasm of skin of trunk: Secondary | ICD-10-CM

## 2021-03-04 DIAGNOSIS — M6740 Ganglion, unspecified site: Secondary | ICD-10-CM

## 2021-03-04 DIAGNOSIS — H0264 Xanthelasma of left upper eyelid: Secondary | ICD-10-CM | POA: Diagnosis not present

## 2021-03-04 DIAGNOSIS — Z1283 Encounter for screening for malignant neoplasm of skin: Secondary | ICD-10-CM | POA: Diagnosis not present

## 2021-03-04 DIAGNOSIS — D229 Melanocytic nevi, unspecified: Secondary | ICD-10-CM

## 2021-03-04 DIAGNOSIS — M67431 Ganglion, right wrist: Secondary | ICD-10-CM

## 2021-03-04 DIAGNOSIS — L821 Other seborrheic keratosis: Secondary | ICD-10-CM

## 2021-03-04 DIAGNOSIS — D1800 Hemangioma unspecified site: Secondary | ICD-10-CM

## 2021-03-04 DIAGNOSIS — H0260 Xanthelasma of unspecified eye, unspecified eyelid: Secondary | ICD-10-CM

## 2021-03-04 DIAGNOSIS — Z86007 Personal history of in-situ neoplasm of skin: Secondary | ICD-10-CM

## 2021-03-04 DIAGNOSIS — L738 Other specified follicular disorders: Secondary | ICD-10-CM

## 2021-03-04 DIAGNOSIS — L578 Other skin changes due to chronic exposure to nonionizing radiation: Secondary | ICD-10-CM

## 2021-03-04 DIAGNOSIS — L814 Other melanin hyperpigmentation: Secondary | ICD-10-CM

## 2021-03-04 NOTE — Patient Instructions (Signed)
Continue taking Heliocare sun protection supplement daily in sunny weather for additional sun protection. For maximum protection on the sunniest days, you can take up to 2 capsules of regular Heliocare OR take 1 capsule of Heliocare Ultra. For prolonged exposure (such as a full day in the sun), you can repeat your dose of the supplement 4 hours after your first dose. Heliocare can be purchased at [employer], at some [facility] or at [email or web address].    Recommend daily broad spectrum sunscreen SPF 30+ to sun-exposed areas, reapply every 2 hours as needed. Call for new or changing lesions.  Staying in the shade or wearing long sleeves, sun glasses (UVA+UVB protection) and wide brim hats (4-inch brim around the entire circumference of the hat) are also recommended for sun protection.   Melanoma ABCDEs  Melanoma is the most dangerous type of skin cancer, and is the leading cause of death from skin disease.  You are more likely to develop melanoma if you: Have light-colored skin, light-colored eyes, or red or blond hair Spend a lot of time in the sun Tan regularly, either outdoors or in a tanning bed Have had blistering sunburns, especially during childhood Have a close family member who has had a melanoma Have atypical moles or large birthmarks  Early detection of melanoma is key since treatment is typically straightforward and cure rates are extremely high if we catch it early.   The first sign of melanoma is often a change in a mole or a new dark spot.  The ABCDE system is a way of remembering the signs of melanoma.  A for asymmetry:  The two halves do not match. B for border:  The edges of the growth are irregular. C for color:  A mixture of colors are present instead of an even brown color. D for diameter:  Melanomas are usually (but not always) greater than 29mm - the size of a pencil eraser. E for evolution:  The spot keeps changing in size, shape, and color.  Please check your  skin once per month between visits. You can use a small mirror in front and a large mirror behind you to keep an eye on the back side or your body.   If you see any new or changing lesions before your next follow-up, please call to schedule a visit.  Please continue daily skin protection including broad spectrum sunscreen SPF 30+ to sun-exposed areas, reapplying every 2 hours as needed when you're outdoors.   Staying in the shade or wearing long sleeves, sun glasses (UVA+UVB protection) and wide brim hats (4-inch brim around the entire circumference of the hat) are also recommended for sun protection.    If You Need Anything After Your Visit  If you have any questions or concerns for your doctor, please call our main line at [phone] and press option 4 to reach your doctor's medical assistant. If no one answers, please leave a voicemail as directed and we will return your call as soon as possible. Messages left after 4 pm will be answered the following business day.   You may also send [place] a message via [place]. We typically respond to MyChart messages within 1-2 business days.  For prescription refills, please ask your pharmacy to contact our office. Our fax number is [phone].  If you have an urgent issue when the clinic is closed that cannot wait until the next business day, you can page your doctor at the number below.    Please note that  while we do our best to be available for urgent issues outside of office hours, we are not available 24/7.   If you have an urgent issue and are unable to reach [place], you may choose to seek medical care at your doctor's office, retail clinic, urgent care center, or emergency room.  If you have a medical emergency, please immediately call 911 or go to the emergency department.  Pager Numbers  - Dr. Nehemiah Massed: [phone]  - Dr. Laurence Ferrari: [phone]  - Dr. Nicole Kindred: [phone]  In the event of inclement weather, please call our main line at  [phone] for an update on the status of any delays or closures.  Dermatology Medication Tips: Please keep the boxes that topical medications come in in order to help keep track of the instructions about where and how to use these. Pharmacies typically print the medication instructions only on the boxes and not directly on the medication tubes.   If your medication is too expensive, please contact our office at [phone] option 4 or send [place] a message through [place].   We are unable to tell what your co-pay for medications will be in advance as this is different depending on your insurance coverage. However, we may be able to find a substitute medication at lower cost or fill out paperwork to get insurance to cover a needed medication.   If a prior authorization is required to get your medication covered by your insurance company, please allow [place] 1-2 business days to complete this process.  Drug prices often vary depending on where the prescription is filled and some pharmacies may offer cheaper prices.  The website www.goodrx.com contains coupons for medications through different pharmacies. The prices here do not account for what the cost may be with help from insurance (it may be cheaper with your insurance), but the website can give you the price if you did not use any insurance.  - You can print the associated coupon and take it with your prescription to the pharmacy.  - You may also stop by our office during regular business hours and pick up a GoodRx coupon card.  - If you need your prescription sent electronically to a different pharmacy, notify our office through [facility] or by phone at [phone] option 4.     Si Usted Necesita Algo Despus de Su Visita  Tambin puede enviarnos un mensaje a travs de Pharmacist, community. Por lo general respondemos a los mensajes de MyChart en el transcurso de 1 a 2 das hbiles.  Para renovar recetas, por favor pida a su farmacia que se  ponga en contacto con nuestra oficina. Harland Dingwall de fax es [place] [phone].  Si tiene un asunto urgente cuando la clnica est cerrada y que no puede esperar hasta el siguiente da hbil, puede llamar/localizar a su doctor(a) al nmero que aparece a continuacin.   Por favor, tenga en cuenta que aunque hacemos todo lo posible para estar disponibles para asuntos urgentes fuera del horario de [place], no estamos disponibles las 24 horas del da, los 7 das de la [place].   Si tiene un problema urgente y no puede comunicarse con nosotros, puede optar por buscar atencin mdica  en el consultorio de su doctor(a), en una clnica privada, en un centro de atencin urgente o en una sala de emergencias.  Si tiene Engineering geologist, por favor llame inmediatamente al 911 o vaya a la sala de emergencias.  Nmeros de bper  - Dr. Nehemiah Massed: [phone]  -  DraLaurence Ferrari: [phone]  - Dra. Nicole Kindred: [phone]  En caso de inclemencias del [place], por favor llame a Johnsie Kindred principal al [phone] para una actualizacin sobre el [place] de cualquier retraso o cierre.  Consejos para la medicacin en dermatologa: Por favor, guarde las cajas en las que vienen los medicamentos de uso tpico para ayudarle a seguir las instrucciones sobre dnde y cmo usarlos. Las farmacias generalmente imprimen las instrucciones del medicamento slo en las cajas y no directamente en los tubos del [place].   Si su medicamento es muy caro, por favor, pngase en contacto con Zigmund Daniel llamando al [phone] y presione la opcin 4 o envenos un mensaje a travs de Pharmacist, community.   No podemos decirle cul ser su copago por los medicamentos por adelantado ya que esto es diferente dependiendo de la cobertura de su seguro. Sin embargo, es posible que podamos encontrar un medicamento sustituto a Electrical engineer un formulario para que el seguro cubra el medicamento que se considera necesario.   Si se requiere  una autorizacin previa para que su compaa de seguros [place] su medicamento, por favor permtanos de 1 a 2 das hbiles para completar este proceso.  Los precios de los medicamentos varan con frecuencia dependiendo del Environmental consultant de dnde se surte la receta y alguna farmacias pueden ofrecer precios ms baratos.  El sitio web www.goodrx.com tiene cupones para medicamentos de Airline pilot. Los precios aqu no tienen en cuenta lo que podra costar con la ayuda del seguro (puede ser ms barato con su seguro), pero el sitio web puede darle el precio si no utiliz Research scientist (physical sciences).  - Puede imprimir el cupn correspondiente y llevarlo con su receta a la farmacia.  - Tambin puede pasar por nuestra oficina durante el horario de atencin regular y Charity fundraiser una tarjeta de cupones de GoodRx.  - Si necesita que su receta se enve electrnicamente a una farmacia diferente, informe a nuestra oficina a travs de MyChart de [place] o por telfono llamando al [phone] y presione la opcin 4.

## 2021-03-04 NOTE — Progress Notes (Signed)
? ?Follow-Up Visit ?  ?Subjective  ?Walter Hood is a 61 y.o. male who presents for the following: Annual Exam (Here for skin cancer screening. Full body. HxBCC, HxSCC, HxAKs. Patient states he has been taking Heliocare). ? ?The patient presents for Total-Body Skin Exam (TBSE) for skin cancer screening and mole check.  The patient has spots, moles and lesions to be evaluated, some may be new or changing and the patient has concerns that these could be cancer. ? ? ?The following portions of the chart were reviewed this encounter and updated as appropriate:  Tobacco  Allergies  Meds  Problems  Med Hx  Surg Hx  Fam Hx   ?  ? ?Review of Systems: No other skin or systemic complaints except as noted in HPI or Assessment and Plan. ? ? ?Objective  ?Well appearing patient in no apparent distress; mood and affect are within normal limits. ? ?A full examination was performed including scalp, head, eyes, ears, nose, lips, neck, chest, axillae, abdomen, back, buttocks, bilateral upper extremities, bilateral lower extremities, hands, feet, fingers, toes, fingernails, and toenails. All findings within normal limits unless otherwise noted below. ? ?Left Upper Eyelid, Right Upper Eyelid ?Yellowish papules/plaques ? ?Right Wrist - Posterior ?Moveable subcutaneous cystic nodule ? ?low back, right chest ?Subcutaneous nodules ? ? ?Assessment & Plan  ? ?History of Squamous Cell Carcinoma in Situ of the Skin- ?vertex scalp tx with MOHS in 2020 ?- No evidence of recurrence today ?- Recommend regular full body skin exams ?- Recommend daily broad spectrum sunscreen SPF 30+ to sun-exposed areas, reapply every 2 hours as needed.  ?- Call if any new or changing lesions are noted between office visits  ? ?History of Basal Cell Carcinoma of the Skin ?- No evidence of recurrence today ?- Recommend regular full body skin exams ?- Recommend daily broad spectrum sunscreen SPF 30+ to sun-exposed areas, reapply every 2 hours as needed.  ?-  Call if any new or changing lesions are noted between office visits  ? ?Dermatofibroma ?- Firm pink/brown papulenodule with dimple sign at right mid back ?- Benign appearing ?- Call for any changes ? ? ?Lentigines ?- Scattered tan macules ?- Due to sun exposure ?- Benign-appearing, observe ?- Recommend daily broad spectrum sunscreen SPF 30+ to sun-exposed areas, reapply every 2 hours as needed. ?- Call for any changes ? ?Seborrheic Keratoses ?- Stuck-on, waxy, tan-brown papules and/or plaques  ?- Benign-appearing ?- Discussed benign etiology and prognosis. ?- Observe ?- Call for any changes ? ?Melanocytic Nevi ?- Tan-brown and/or pink-flesh-colored symmetric macules and papules ?- Benign appearing on exam today ?- Observation ?- Call clinic for new or changing moles ?- Recommend daily use of broad spectrum spf 30+ sunscreen to sun-exposed areas.  ? ?Hemangiomas ?- Red papules ?- Discussed benign nature ?- Observe ?- Call for any changes ? ?Actinic Damage ?- Chronic condition, secondary to cumulative UV/sun exposure ?- diffuse scaly erythematous macules with underlying dyspigmentation ?- Recommend daily broad spectrum sunscreen SPF 30+ to sun-exposed areas, reapply every 2 hours as needed.  ?- Staying in the shade or wearing long sleeves, sun glasses (UVA+UVB protection) and wide brim hats (4-inch brim around the entire circumference of the hat) are also recommended for sun protection.  ?- Call for new or changing lesions. ? ?Skin cancer screening performed today. ? ?Sebaceous Hyperplasia ?- Small yellow papules with a central dell scattered at face ?- Benign ?- Observe  ?- $350 cosmetic charge to treat entire area ? ?Xanthelasma (2) ?  Left Upper Eyelid; Right Upper Eyelid ? ?Benign-appearing, observe.  Can treat if becomes bothersome.  ? ?Ganglion cyst ?Right Wrist - Posterior ? ?Favor ganglion cyst. Recommend consult with hand surgeon to discuss likely diagnosis and removal. ? ?Lipoma of torso ?low back, right  chest ? ?Vs normal fat deposits ? ?Benign-appearing.  Observation.  Call clinic for new or changing lesions. ? ? ?Return in about 1 year (around 03/05/2022) for TBSE. ? ?I, Emelia Salisbury, CMA, am acting as scribe for Forest Gleason, MD. ? ? ?Documentation: I have reviewed the above documentation for accuracy and completeness, and I agree with the above. ? ?Forest Gleason, MD ? ? ?

## 2021-03-07 ENCOUNTER — Encounter: Payer: Self-pay | Admitting: Dermatology

## 2021-03-11 ENCOUNTER — Other Ambulatory Visit: Payer: Self-pay

## 2021-03-11 ENCOUNTER — Other Ambulatory Visit: Payer: Managed Care, Other (non HMO)

## 2021-03-11 DIAGNOSIS — E291 Testicular hypofunction: Secondary | ICD-10-CM

## 2021-03-12 LAB — TESTOSTERONE: Testosterone: 365 ng/dL (ref 264–916)

## 2021-03-21 ENCOUNTER — Other Ambulatory Visit: Payer: Self-pay | Admitting: Urology

## 2021-03-21 DIAGNOSIS — E291 Testicular hypofunction: Secondary | ICD-10-CM

## 2021-03-21 LAB — TESTOSTERONE, FREE: Testosterone, Free: 5 pg/mL — ABNORMAL LOW (ref 6.6–18.1)

## 2021-03-21 LAB — SPECIMEN STATUS REPORT

## 2021-03-21 MED ORDER — TESTOSTERONE CYPIONATE 200 MG/ML IM SOLN: 200.0000 mg | INTRAMUSCULAR | 0 refills | Status: DC

## 2021-03-22 ENCOUNTER — Telehealth: Payer: Self-pay | Admitting: *Deleted

## 2021-03-22 NOTE — Telephone Encounter (Signed)
Notified patient as instructed, patient pleased °

## 2021-03-22 NOTE — Telephone Encounter (Signed)
-----   Message from Abbie Sons, MD sent at 03/21/2021 12:23 PM EDT ----- ?Free testosterone level was low.  I sent in an Rx testosterone.  Let him know this will need prior authorization.  He will then need an appointment for injection training.  Follow-up lab visit testosterone level 4-6 weeks after starting injections ?

## 2021-03-23 ENCOUNTER — Other Ambulatory Visit: Payer: Self-pay

## 2021-03-23 ENCOUNTER — Encounter: Payer: Self-pay | Admitting: Urology

## 2021-03-23 ENCOUNTER — Ambulatory Visit (INDEPENDENT_AMBULATORY_CARE_PROVIDER_SITE_OTHER): Payer: Managed Care, Other (non HMO) | Admitting: Urology

## 2021-03-23 VITALS — BP 144/88 | HR 99 | Ht 74.0 in | Wt 227.0 lb

## 2021-03-23 DIAGNOSIS — E349 Endocrine disorder, unspecified: Secondary | ICD-10-CM

## 2021-03-23 MED ORDER — "BD DISP NEEDLES 21G X 1-1/2"" MISC": 1.0000 mg | 0 refills | Status: DC

## 2021-03-23 MED ORDER — SYRINGE 2-3 ML 3 ML MISC: 1.0000 mg | 3 refills | Status: DC

## 2021-03-23 MED ORDER — "BD DISP NEEDLES 18G X 1-1/2"" MISC": 1.0000 mg | 0 refills | Status: DC

## 2021-03-23 NOTE — Progress Notes (Signed)
Walter Hood is a 61 y.o.  male with testosterone deficiency who presents today for instruction on delivering an IM injection of testosterone cypionate.   ? ?I instructed the patient to identify the concentration of his testosterone. Testosterone for injection is usually in the form of testosterone cypionate. These liquids come in multiple concentrations, so before giving an injection, it?s very important to make sure that his intended dosage takes into account the concentration of the testosterone serum. Usually, testosterone comes in a concentration of either 100 mg/ml or 200 mg/ml.  We typically use the 200 mg/mL in this office.   ? ?Using a sterile, 18 G needle and 3 cc syringe, the testosterone cypionate (lot # [id number] and exp 11/2023) was drawn up for a 1 cc injection.  To draw up the dose, I demonstrated how to first draw air into the syringe equal to the volume of the dosage. Then, wipe the top of the medication bottle with an alcohol wipe, insert the needle through the lid and into the medication, and push the air from your syringe into the bottle. Turn the bottle upside down and draw out the exact dosage of testosterone. ? ?I demonstrated how to aspirate the syringe by hinge the syringe with its needle uncapped and pointing up in front of him.  Looking for air bubbles in the syringe. Flick the side of the syringe to get these bubbles to rise to the top.   ? ?When the dosage is bubble-free, I slowly depressed the plunger to force the air at the top of the syringe out stopping when a tiny drop of medication comes out of the tip of the syringe.  I advised him to be certain no air remained in the syringe as injecting air is very dangerous.  Being careful not to squirt or spray a significant portion of the dosage onto the floor. ? ?Preparing the injection site, outer middle third of the vastus lateralis muscle of the thigh on the left, I took a sterile alcohol pad and wipe the immediate area around where I  intended him to inject.  ? ?I then demonstrated how to change the needle from the 18 G to the 21 G needle. ? ?I then gave him the syringe for injecting.  He correctly identified the injection location. ? ?He held the syringe like a dart at a 90-degree angle above the sterile injection site. Quickly plunged it into the flesh. Before depressing the plunger, he drew back on it slightly and no blood was seen.  I advised him that if blood flashed in the syringe, he would need to remove the needle and then select a different location as he was in the vein.  Inject the medication at a steady, controlled pace. ? ?He fully depressed the plunger, slowly pull the needle out. Pressing around the injection site with a sterile cotton swab as he did so - this preventing the emerging needle from pulling on the skin and causing extra pain.  We assessed the needle entry point for bleeding, and applied a sterile Band-Aid and/or cotton swab if needed. Disposed of the used needle and syringe in a proper sharps container.  I advised him to acquire a sharps container for his personal use at home. ? ?I advised him that If, after injection, he experienced redness, swelling, or discomfort beyond that of normal soreness at the site of injection, call our office for an appointment and instructions. ? ?He is to always store his medication at the  recommended temperature, and always check the expiration date on the bottle. If it?s expired, don't use it.  Of course, keep all of med's out of reach of children.  Do not change his dose without consulting your provider. ? ?His starting dose will be 1 cc every 2 weeks.  He will return 1 week after his fourth injection for a testosterone level ? ?Dawnetta Copenhaver, PA-C ? ?I spent 15 minutes on the day of the encounter to include pre-visit record review, face-to-face time with the patient, and post-visit ordering of tests.  ?

## 2021-03-23 NOTE — Addendum Note (Signed)
Addended by: Zara Council A on: 03/23/2021 01:19 PM ? ? Modules accepted: Orders ? ?

## 2021-03-23 NOTE — Patient Instructions (Signed)
Thank you for your service!  ?

## 2021-03-30 ENCOUNTER — Other Ambulatory Visit: Payer: Self-pay | Admitting: Urology

## 2021-04-27 ENCOUNTER — Other Ambulatory Visit: Payer: Managed Care, Other (non HMO)

## 2021-04-27 DIAGNOSIS — E349 Endocrine disorder, unspecified: Secondary | ICD-10-CM

## 2021-04-28 LAB — HEMOGLOBIN AND HEMATOCRIT, BLOOD
Hematocrit: 43.8 % (ref 37.5–51.0)
Hemoglobin: 15.3 g/dL (ref 13.0–17.7)

## 2021-04-28 LAB — TESTOSTERONE: Testosterone: 454 ng/dL (ref 264–916)

## 2021-05-04 ENCOUNTER — Telehealth: Payer: Self-pay | Admitting: Urology

## 2021-05-04 NOTE — Telephone Encounter (Signed)
testosterone cypionate (DEPOTESTOSTERONE CYPIONATE) 200 MG/ML injection ? ?Patient has 3 set of needles ? [facility] ? ? ?

## 2021-05-05 MED ORDER — TESTOSTERONE CYPIONATE 200 MG/ML IM SOLN: 200.0000 mg | INTRAMUSCULAR | 2 refills | Status: DC

## 2021-05-05 NOTE — Addendum Note (Signed)
Addended by: Abbie Sons on: 05/05/2021 12:49 PM ? ? Modules accepted: Orders ? ?

## 2021-05-05 NOTE — Telephone Encounter (Signed)
Please schedule a follow-up office visit in July 2023 with a testosterone, hematocrit, PSA prior ?

## 2021-07-05 ENCOUNTER — Other Ambulatory Visit: Payer: Managed Care, Other (non HMO)

## 2021-07-05 DIAGNOSIS — E291 Testicular hypofunction: Secondary | ICD-10-CM

## 2021-07-06 LAB — TESTOSTERONE: Testosterone: 917 ng/dL — ABNORMAL HIGH (ref 264–916)

## 2021-07-06 LAB — HEMATOCRIT: Hematocrit: 41.2 % (ref 37.5–51.0)

## 2021-07-06 LAB — PSA: Prostate Specific Ag, Serum: <0.1 ng/mL (ref 0.0–4.0)

## 2021-07-09 ENCOUNTER — Ambulatory Visit: Payer: Managed Care, Other (non HMO) | Admitting: Urology

## 2021-07-12 ENCOUNTER — Ambulatory Visit (INDEPENDENT_AMBULATORY_CARE_PROVIDER_SITE_OTHER): Payer: Managed Care, Other (non HMO) | Admitting: Urology

## 2021-07-12 ENCOUNTER — Encounter: Payer: Self-pay | Admitting: Urology

## 2021-07-12 VITALS — BP 158/82 | HR 60 | Ht 74.0 in | Wt 221.0 lb

## 2021-07-12 DIAGNOSIS — Z8546 Personal history of malignant neoplasm of prostate: Secondary | ICD-10-CM | POA: Diagnosis not present

## 2021-07-12 DIAGNOSIS — N200 Calculus of kidney: Secondary | ICD-10-CM | POA: Diagnosis not present

## 2021-07-12 DIAGNOSIS — E291 Testicular hypofunction: Secondary | ICD-10-CM

## 2021-07-12 DIAGNOSIS — E349 Endocrine disorder, unspecified: Secondary | ICD-10-CM

## 2021-07-12 MED ORDER — TESTOSTERONE CYPIONATE 200 MG/ML IM SOLN: 200.0000 mg | INTRAMUSCULAR | 2 refills | Status: DC

## 2021-07-12 NOTE — Progress Notes (Signed)
07/12/2021 9:11 AM   [name] 05-Nov-1960 [id number]  Referring provider: Kirk Ruths, MD [facility] [place],   [id number]  Chief Complaint  Patient presents with   Hypogonadism    Urologic history: 1.  Personal history prostate cancer             - RALP Dr. Alinda Money 2007             -Undetectable PSA   2.  Nephrolithiasis             -Recurrent stone disease             -12 mm left renal calculus   3.  Erectile dysfunction             -Post prostatectomy  4.  Hypogonadism  -TRT testosterone cypionate  HPI: 61 y.o. male presents for annual follow-up.  No flank or abdominal pain since last years visit  Denies dysuria, gross hematuria No flank, abdominal or pelvic pain Did restart TRT after last visit Labs 07/05/2021: Testosterone 917; PSA < 0.1; hct 41.2.  Blood draw was midcycle Some worsening ED and is interested in pursuing a penile implant after he enrolled in Medicare    PMH: Past Medical History:  Diagnosis Date   Basal cell carcinoma    ED (erectile dysfunction)    History of prostate cancer    Kidney stone    Squamous cell carcinoma of skin 12/25/18 MOHs   Vertex Scalp, SCCIS    Surgical History: Past Surgical History:  Procedure Laterality Date   PROSTATE SURGERY     PROSTATECTOMY  2007    Home Medications:  Allergies as of 07/12/2021   No Known Allergies      Medication List        Accurate as of July 12, 2021  9:11 AM. If you have any questions, ask your nurse or doctor.          STOP taking these medications    meloxicam 15 MG tablet Commonly known as: MOBIC       TAKE these medications    2-3CC SYRINGE 3 ML Misc 1 mg by Does not apply route every 14 (fourteen) days.   aspirin EC 81 MG tablet Take 81 mg by mouth.   BD Disp Needles 18G X 1-1/2" Misc Generic drug: NEEDLE (DISP) 18 G 1 mg by Does not apply route every 14 (fourteen) days.   BD Disp Needles 21G X 1-1/2"  Misc Generic drug: NEEDLE (DISP) 21 G 1 mg by Does not apply route every 14 (fourteen) days.   MULTIVITAMIN ADULT PO Take by mouth daily.   pantoprazole 40 MG tablet Commonly known as: PROTONIX   tadalafil 5 MG tablet Commonly known as: CIALIS TAKE 1 TABLET BY MOUTH  DAILY   testosterone cypionate 200 MG/ML injection Commonly known as: DEPOTESTOSTERONE CYPIONATE Inject 1 mL (200 mg total) into the muscle every 14 (fourteen) days.   tiZANidine 4 MG tablet Commonly known as: ZANAFLEX Take by mouth.        Allergies: No Known Allergies  Family History: No family history on file.  Social History:  reports that he has never smoked. He has never used smokeless tobacco. He reports that he does not currently use alcohol. He reports that he does not use drugs.   Physical Exam: BP (!) 158/82   Pulse 60   Ht '6\' 2"'$  (1.88 m)   Wt 221 lb (100.2  kg)   BMI 28.37 kg/m   Constitutional:  Alert and oriented, No acute distress. HEENT: [place] AT Respiratory: Normal respiratory effort, no increased work of breathing. Skin: No rashes, bruises or suspicious lesions. Neurologic: Grossly intact, no focal deficits, moving all 4 extremities. Psychiatric: Normal mood and affect.   Assessment & Plan:    1.  Nephrolithiasis KUB 02/2021 stable  2.  History prostate cancer PSA remains undetectable  3.  Hypogonadism Midcycle level slightly elevated with previous midcycle's in the 400 range.  He wanted to recheck in 4-6 weeks and lower dose if still elevated.  Testosterone was refilled  Continue annual follow-up   Abbie Sons, MD  [facility] [address] [place], [place] [address] [phone]

## 2021-07-12 NOTE — Patient Instructions (Signed)
Dr. [facility]  Dr. Stann Mainland [facility] Dr. Terance Hart [facility]

## 2021-08-24 ENCOUNTER — Other Ambulatory Visit: Payer: Managed Care, Other (non HMO)

## 2021-08-24 DIAGNOSIS — Z8546 Personal history of malignant neoplasm of prostate: Secondary | ICD-10-CM

## 2021-08-24 DIAGNOSIS — E349 Endocrine disorder, unspecified: Secondary | ICD-10-CM

## 2021-08-25 LAB — TESTOSTERONE: Testosterone: 239 ng/dL — ABNORMAL LOW (ref 264–916)

## 2021-08-25 LAB — PSA: Prostate Specific Ag, Serum: <0.1 ng/mL (ref 0.0–4.0)

## 2021-08-25 LAB — HEMATOCRIT: Hematocrit: 45.6 % (ref 37.5–51.0)

## 2021-08-27 ENCOUNTER — Telehealth: Payer: Self-pay | Admitting: Family Medicine

## 2021-08-27 NOTE — Telephone Encounter (Signed)
-----   Message from Abbie Sons, MD sent at 08/27/2021  7:28 AM EDT ----- Repeat testosterone level was slightly low at 239.  Level was elevated 1 month ago and we discussed lowering dose if persistently elevated.  Would not recommend any changes at this point.  If he has worsening symptoms can repeat a trough testosterone level in 6-8 weeks

## 2021-08-27 NOTE — Telephone Encounter (Signed)
Patient notified and voiced understanding.

## 2021-09-15 ENCOUNTER — Telehealth: Payer: Self-pay | Admitting: Urology

## 2021-09-15 ENCOUNTER — Other Ambulatory Visit: Payer: Self-pay | Admitting: *Deleted

## 2021-09-15 MED ORDER — "BD DISP NEEDLES 21G X 1-1/2"" MISC": 1.0000 mg | 0 refills | Status: DC

## 2021-09-15 MED ORDER — "BD DISP NEEDLES 18G X 1-1/2"" MISC": 1.0000 mg | 0 refills | Status: DC

## 2021-09-15 NOTE — Telephone Encounter (Signed)
Patient called the office today requesting a new prescription for 21 gauge, 1 inch needles [facility] [address] [facility] [place].

## 2021-09-21 IMAGING — CR DG ABDOMEN 1V
1 series · 2 of 2 positions shown · non-contrast
Comparison: One-view abdomen 02/25/2019. CT stone protocol
11/07/2018

CLINICAL DATA: Kidney stones.

EXAM:
ABDOMEN - 1 VIEW

[Series 1: dg abd 1 view · 0.14mm/px · 2 of 2 slices shown]
[im 1/2]
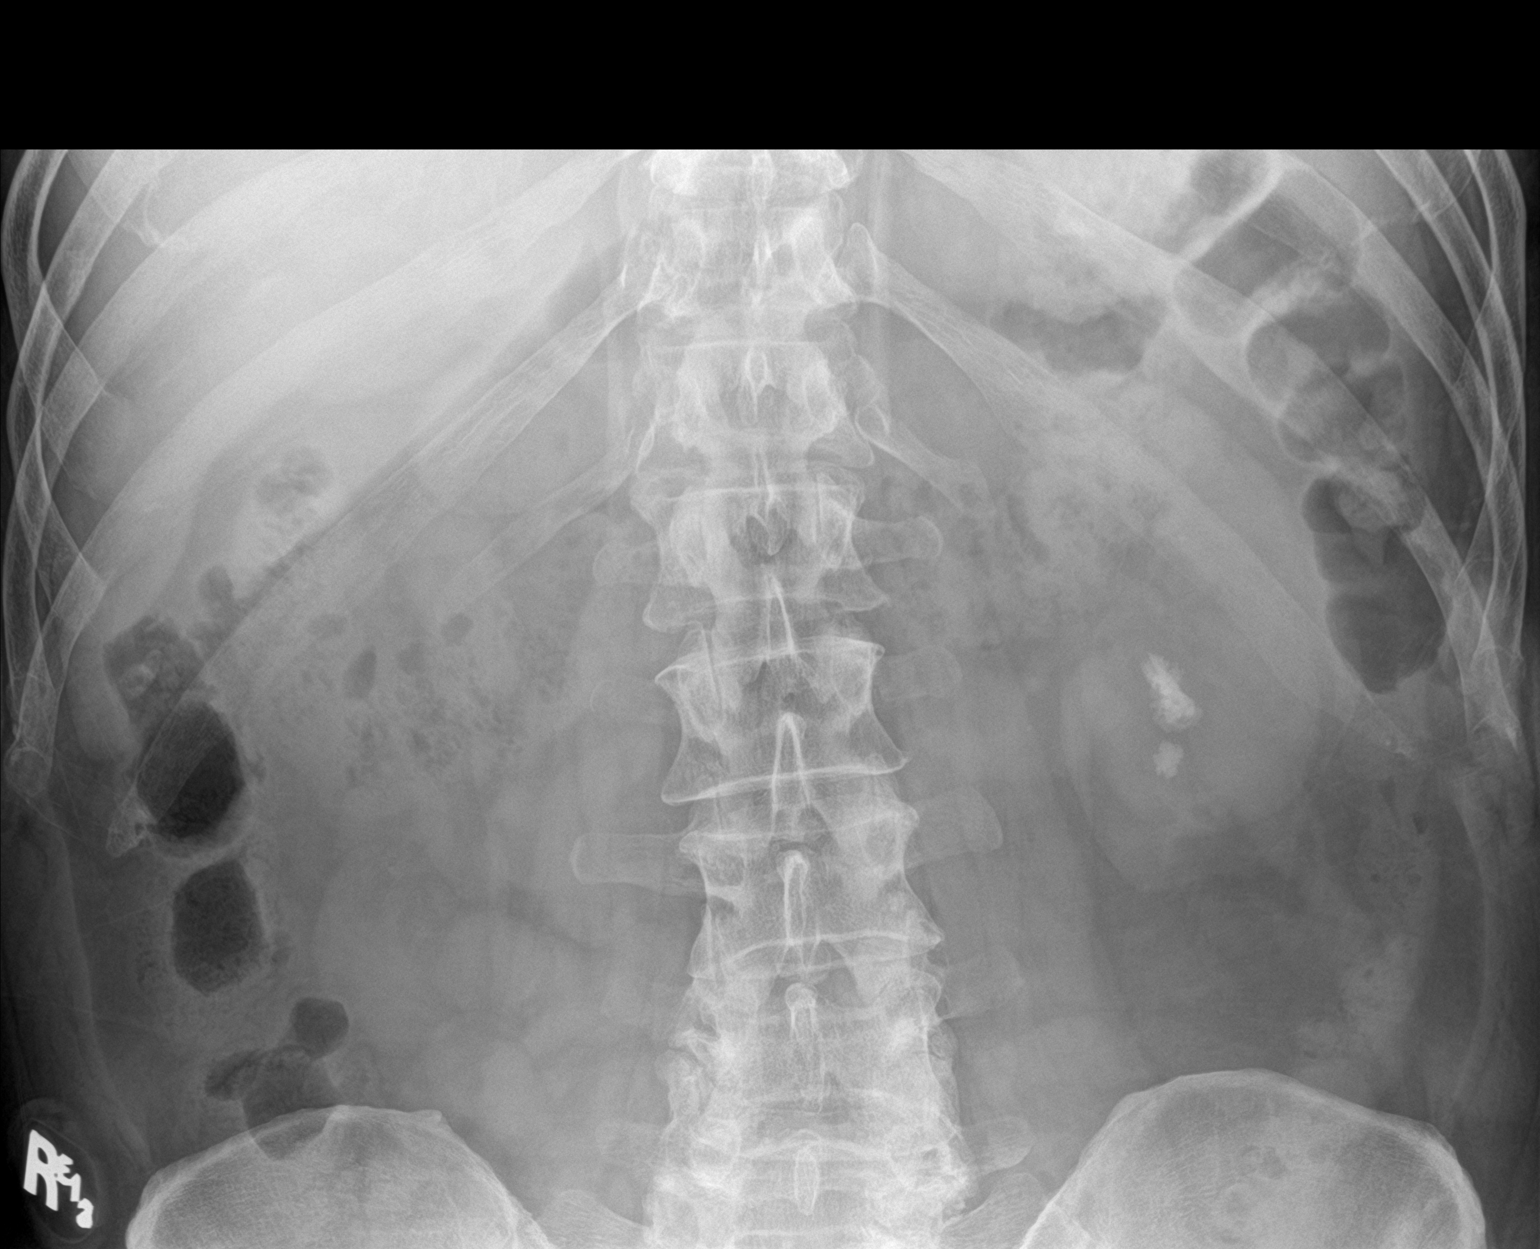
[im 2/2]
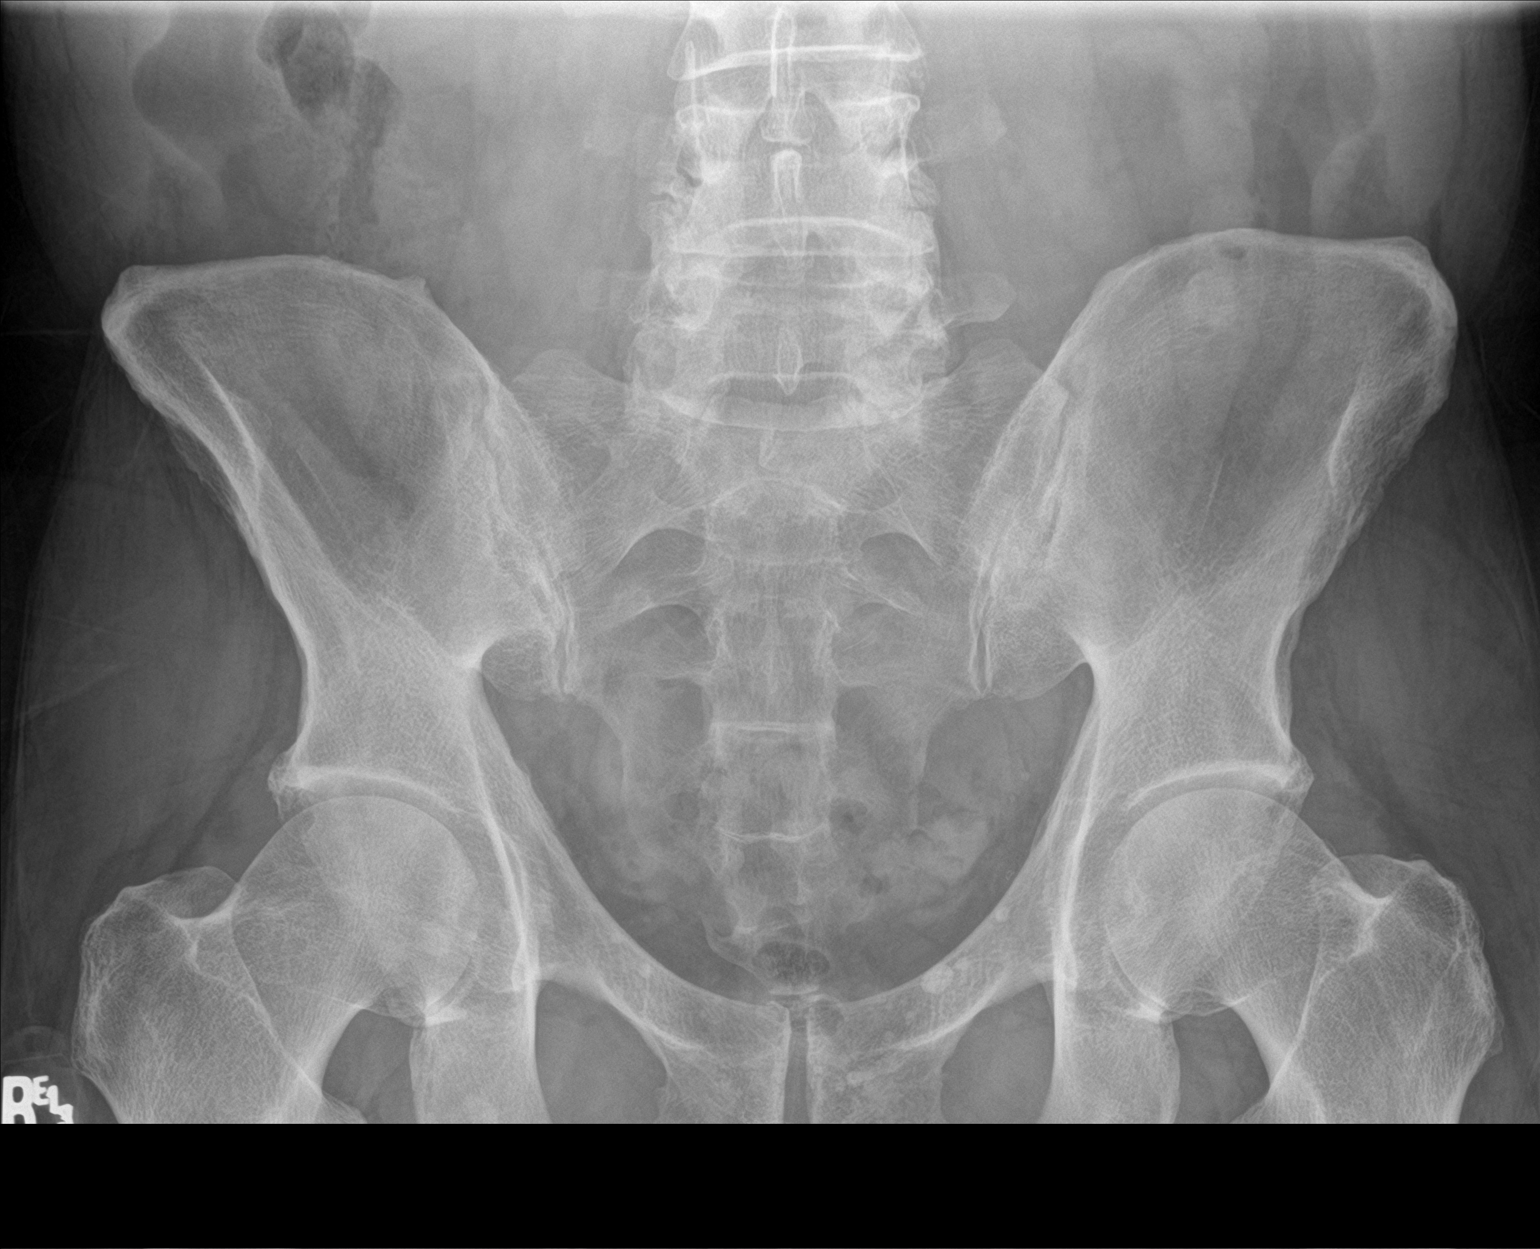

[2 of 2 positions shown; findings below may reference images not displayed]

FINDINGS: Two large stones are again noted at the lower pole of the left
kidney, not significantly changed in size. No significant
right-sided nephrolithiasis is present. Bowel gas pattern is normal.
Pelvic phleboliths are stable.
IMPRESSION: Stable left lower pole renal stones.

## 2021-10-05 ENCOUNTER — Other Ambulatory Visit: Payer: Self-pay | Admitting: Family Medicine

## 2021-10-05 ENCOUNTER — Telehealth: Payer: Self-pay | Admitting: Urology

## 2021-10-05 MED ORDER — "BD SAFETYGLIDE NEEDLE 21G X 1"" MISC": 0 refills | Status: DC

## 2021-10-05 NOTE — Telephone Encounter (Signed)
Spoke to patient and the pharmacy told him that the 21guage 1 1/2 needles are on back order. I adjusted the rx and sent to pharmacy.

## 2021-10-05 NOTE — Telephone Encounter (Signed)
Pt called the and asking needles and syringes.  [facility] .Please call pt and he can exp[lain it to you.  [phone]

## 2021-11-03 ENCOUNTER — Other Ambulatory Visit: Payer: Self-pay | Admitting: *Deleted

## 2022-01-13 ENCOUNTER — Other Ambulatory Visit: Payer: Managed Care, Other (non HMO)

## 2022-02-16 ENCOUNTER — Other Ambulatory Visit: Payer: Managed Care, Other (non HMO)

## 2022-02-23 ENCOUNTER — Ambulatory Visit: Payer: Managed Care, Other (non HMO) | Admitting: Urology

## 2022-03-10 ENCOUNTER — Ambulatory Visit (INDEPENDENT_AMBULATORY_CARE_PROVIDER_SITE_OTHER): Payer: Medicare PPO | Admitting: Dermatology

## 2022-03-10 ENCOUNTER — Encounter: Payer: Self-pay | Admitting: Dermatology

## 2022-03-10 VITALS — BP 152/85 | HR 79

## 2022-03-10 DIAGNOSIS — Z85828 Personal history of other malignant neoplasm of skin: Secondary | ICD-10-CM | POA: Diagnosis not present

## 2022-03-10 DIAGNOSIS — L814 Other melanin hyperpigmentation: Secondary | ICD-10-CM

## 2022-03-10 DIAGNOSIS — Z86007 Personal history of in-situ neoplasm of skin: Secondary | ICD-10-CM | POA: Diagnosis not present

## 2022-03-10 DIAGNOSIS — Z1283 Encounter for screening for malignant neoplasm of skin: Secondary | ICD-10-CM | POA: Diagnosis not present

## 2022-03-10 DIAGNOSIS — L821 Other seborrheic keratosis: Secondary | ICD-10-CM

## 2022-03-10 DIAGNOSIS — L578 Other skin changes due to chronic exposure to nonionizing radiation: Secondary | ICD-10-CM

## 2022-03-10 DIAGNOSIS — R202 Paresthesia of skin: Secondary | ICD-10-CM | POA: Diagnosis not present

## 2022-03-10 DIAGNOSIS — D229 Melanocytic nevi, unspecified: Secondary | ICD-10-CM

## 2022-03-10 NOTE — Progress Notes (Signed)
Follow-Up Visit   Subjective  Walter Hood is a 62 y.o. male who presents for the following: TBSE (The patient presents for Total-Body Skin Exam (TBSE) for skin cancer screening and mole check.  The patient has spots, moles and lesions to be evaluated, some may be new or changing and the patient has concerns that these could be cancer. Patient with hx of BCC, SCCis.).  Patient would like to know if we could accept his wife as a new patient. He does have a spot at back that has been itching.  The following portions of the chart were reviewed this encounter and updated as appropriate:   Tobacco  Allergies  Meds  Problems  Med Hx  Surg Hx  Fam Hx      Review of Systems:  No other skin or systemic complaints except as noted in HPI or Assessment and Plan.  Objective  Well appearing patient in no apparent distress; mood and affect are within normal limits.  A full examination was performed including scalp, head, eyes, ears, nose, lips, neck, chest, axillae, abdomen, back, buttocks, bilateral upper extremities, bilateral lower extremities, hands, feet, fingers, toes, fingernails, and toenails. All findings within normal limits unless otherwise noted below.  Right Upper Back Clear skin    Assessment & Plan  Notalgia paresthetica Right Upper Back  Notalgia paresthetica - Perispinal hyperpigmented patch - Chronic condition without cure - Secondary to pinched nerve along spine causing itching or sensation changes in an area of skin. Chronic rubbing or scratching causes darkening of the skin.  - OTC treatments which can help with itch include numbing creams like pramoxine or lidocaine which temporarily reduce itch or Capsaicin-containing creams which cause a burning sensation but which sometimes over time will reset the nerves to stop producing itch.  - If you choose to use Capsaicin cream, it is recommended to use it 5 times daily for 1 week followed by 3 times daily for 3-6 weeks.  You may have to continue using it long-term.  - If not doing well with OTC options, could consider [employer] compounded prescription anti-itch cream with Amitriptyline 5% / Lidocaine 5% / Pramoxine 1% or Amitriptyline 5% / Gabapentin 10% / Lidocaine 5% Cream - For severe cases, there are some prescription cream or pill options which may help.   History of Basal Cell Carcinoma of the Skin - No evidence of recurrence today - Recommend regular full body skin exams - Recommend daily broad spectrum sunscreen SPF 30+ to sun-exposed areas, reapply every 2 hours as needed.  - Call if any new or changing lesions are noted between office visits  History of Squamous Cell Carcinoma in Situ of the Skin - No evidence of recurrence today - Recommend regular full body skin exams - Recommend daily broad spectrum sunscreen SPF 30+ to sun-exposed areas, reapply every 2 hours as needed.  - Call if any new or changing lesions are noted between office visits  Lentigines - Scattered tan macules - Due to sun exposure - Benign-appearing, observe - Recommend daily broad spectrum sunscreen SPF 30+ to sun-exposed areas, reapply every 2 hours as needed. - Call for any changes  Seborrheic Keratoses - Stuck-on, waxy, tan-brown papules and/or plaques  - Benign-appearing - Discussed benign etiology and prognosis. - Observe - Call for any changes  Melanocytic Nevi - Tan-brown and/or pink-flesh-colored symmetric macules and papules - Benign appearing on exam today - Observation - Call clinic for new or changing moles - Recommend daily use of  broad spectrum spf 30+ sunscreen to sun-exposed areas.   Hemangiomas - Red papules - Discussed benign nature - Observe - Call for any changes  Actinic Damage - Chronic condition, secondary to cumulative UV/sun exposure - diffuse scaly erythematous macules with underlying dyspigmentation - Recommend daily broad spectrum sunscreen SPF 30+ to sun-exposed areas,  reapply every 2 hours as needed.  - Staying in the shade or wearing long sleeves, sun glasses (UVA+UVB protection) and wide brim hats (4-inch brim around the entire circumference of the hat) are also recommended for sun protection.  - Call for new or changing lesions.  Skin cancer screening performed today.  Return in about 6 months (around 09/10/2022) for TBSE, Hx SCCis, Hx BCC.  Graciella Belton, RMA, am acting as scribe for Forest Gleason, MD .  Documentation: I have reviewed the above documentation for accuracy and completeness, and I agree with the above.  Forest Gleason, MD

## 2022-03-10 NOTE — Patient Instructions (Addendum)
Recommend OTC Gold Bond Rapid Relief Anti-Itch cream (pramoxine + menthol), CeraVe Anti-itch cream or lotion (pramoxine), Sarna lotion (Original- menthol + camphor or Sensitive- pramoxine) or Eucerin 12 hour Itch Relief lotion (menthol) up to 3 times per day to areas on body that are itchy.  In case of bleeding, recommend holding firm pressure for 10 minutes without removing pressure. If there is still bleeding, recommend OTC BleedStop Powder available at [employer]. This can be sprinkled onto a clean wound and will help stop bleeding.   Recommend taking Heliocare sun protection supplement daily in sunny weather for additional sun protection. For maximum protection on the sunniest days, you can take up to 2 capsules of regular Heliocare OR take 1 capsule of Heliocare Ultra. For prolonged exposure (such as a full day in the sun), you can repeat your dose of the supplement 4 hours after your first dose. Heliocare can be purchased at [employer], at some [facility] or at [email or web address].   Melanoma ABCDEs  Melanoma is the most dangerous type of skin cancer, and is the leading cause of death from skin disease.  You are more likely to develop melanoma if you: Have light-colored skin, light-colored eyes, or red or blond hair Spend a lot of time in the sun Tan regularly, either outdoors or in a tanning bed Have had blistering sunburns, especially during childhood Have a close family member who has had a melanoma Have atypical moles or large birthmarks  Early detection of melanoma is key since treatment is typically straightforward and cure rates are extremely high if we catch it early.   The first sign of melanoma is often a change in a mole or a new dark spot.  The ABCDE system is a way of remembering the signs of melanoma.  A for asymmetry:  The two halves do not match. B for border:  The edges of the growth are irregular. C for color:  A mixture of colors are present instead of an even  brown color. D for diameter:  Melanomas are usually (but not always) greater than 30m - the size of a pencil eraser. E for evolution:  The spot keeps changing in size, shape, and color.  Please check your skin once per month between visits. You can use a small mirror in front and a large mirror behind you to keep an eye on the back side or your body.   If you see any new or changing lesions before your next follow-up, please call to schedule a visit.  Please continue daily skin protection including broad spectrum sunscreen SPF 30+ to sun-exposed areas, reapplying every 2 hours as needed when you're outdoors.    Due to recent changes in healthcare laws, you may see results of your pathology and/or laboratory studies on MyChart before the doctors have had a chance to review them. We understand that in some cases there may be results that are confusing or concerning to you. Please understand that not all results are received at the same time and often the doctors may need to interpret multiple results in order to provide you with the best plan of care or course of treatment. Therefore, we ask that you please give u[place]2 business days to thoroughly review all your results before contacting the office for clarification. Should we see a critical lab result, you will be contacted sooner.   If You Need Anything After Your Visit  If you have any questions or concerns for your doctor, please call our  main line at [phone] and press option 4 to reach your doctor's medical assistant. If no one answers, please leave a voicemail as directed and we will return your call as soon as possible. Messages left after 4 pm will be answered the following business day.   You may also send [place] a message via [place]. We typically respond to MyChart messages within 1-2 business days.  For prescription refills, please ask your pharmacy to contact our office. Our fax number is [phone].  If you have an urgent issue when  the clinic is closed that cannot wait until the next business day, you can page your doctor at the number below.    Please note that while we do our best to be available for urgent issues outside of office hours, we are not available 24/7.   If you have an urgent issue and are unable to reach [place], you may choose to seek medical care at your doctor's office, retail clinic, urgent care center, or emergency room.  If you have a medical emergency, please immediately call 911 or go to the emergency department.  Pager Numbers  - Dr. Nehemiah Massed: [phone]  - Dr. Laurence Ferrari: [phone]  - Dr. Nicole Kindred: [phone]  In the event of inclement weather, please call our main line at [phone] for an update on the status of any delays or closures.  Dermatology Medication Tips: Please keep the boxes that topical medications come in in order to help keep track of the instructions about where and how to use these. Pharmacies typically print the medication instructions only on the boxes and not directly on the medication tubes.   If your medication is too expensive, please contact our office at [phone] option 4 or send [place] a message through [place].   We are unable to tell what your co-pay for medications will be in advance as this is different depending on your insurance coverage. However, we may be able to find a substitute medication at lower cost or fill out paperwork to get insurance to cover a needed medication.   If a prior authorization is required to get your medication covered by your insurance company, please allow [place] 1-2 business days to complete this process.  Drug prices often vary depending on where the prescription is filled and some pharmacies may offer cheaper prices.  The website www.goodrx.com contains coupons for medications through different pharmacies. The prices here do not account for what the cost may be with help from insurance (it may be cheaper with your insurance), but the  website can give you the price if you did not use any insurance.  - You can print the associated coupon and take it with your prescription to the pharmacy.  - You may also stop by our office during regular business hours and pick up a GoodRx coupon card.  - If you need your prescription sent electronically to a different pharmacy, notify our office through [facility] or by phone at [phone] option 4.     Si Usted Necesita Algo Despus de Su Visita  Tambin puede enviarnos un mensaje a travs de Pharmacist, community. Por lo general respondemos a los mensajes de MyChart en el transcurso de 1 a 2 das hbiles.  Para renovar recetas, por favor pida a su farmacia que se ponga en contacto con nuestra oficina. Harland Dingwall de fax es [place] [phone].  Si tiene un asunto urgente cuando la clnica est cerrada y que no puede esperar hasta el siguiente da hbil, Hydrologist  a su doctor(a) al nmero que aparece a continuacin.   Por favor, tenga en cuenta que aunque hacemos todo lo posible para estar disponibles para asuntos urgentes fuera del horario de [place], no estamos disponibles las 24 horas del da, los 7 das de la [place].   Si tiene un problema urgente y no puede comunicarse con nosotros, puede optar por buscar atencin mdica  en el consultorio de su doctor(a), en una clnica privada, en un centro de atencin urgente o en una sala de emergencias.  Si tiene Engineering geologist, por favor llame inmediatamente al 911 o vaya a la sala de emergencias.  Nmeros de bper  - Dr. Nehemiah Massed: [phone]  - Dra. Moye: [phone]  - Dra. Nicole Kindred: [phone]  En caso de inclemencias del IXL, por favor llame a Johnsie Kindred principal al [phone] para una actualizacin sobre el [place] de cualquier retraso o cierre.  Consejos para la medicacin en dermatologa: Por favor, guarde las cajas en las que vienen los medicamentos de uso tpico para ayudarle a seguir las  instrucciones sobre dnde y cmo usarlos. Las farmacias generalmente imprimen las instrucciones del medicamento slo en las cajas y no directamente en los tubos del [place].   Si su medicamento es muy caro, por favor, pngase en contacto con Zigmund Daniel llamando al [phone] y presione la opcin 4 o envenos un mensaje a travs de Pharmacist, community.   No podemos decirle cul ser su copago por los medicamentos por adelantado ya que esto es diferente dependiendo de la cobertura de su seguro. Sin embargo, es posible que podamos encontrar un medicamento sustituto a Electrical engineer un formulario para que el seguro cubra el medicamento que se considera necesario.   Si se requiere una autorizacin previa para que su compaa de seguros [place] su medicamento, por favor permtanos de 1 a 2 das hbiles para completar este proceso.  Los precios de los medicamentos varan con frecuencia dependiendo del Environmental consultant de dnde se surte la receta y alguna farmacias pueden ofrecer precios ms baratos.  El sitio web www.goodrx.com tiene cupones para medicamentos de Airline pilot. Los precios aqu no tienen en cuenta lo que podra costar con la ayuda del seguro (puede ser ms barato con su seguro), pero el sitio web puede darle el precio si no utiliz Research scientist (physical sciences).  - Puede imprimir el cupn correspondiente y llevarlo con su receta a la farmacia.  - Tambin puede pasar por nuestra oficina durante el horario de atencin regular y Charity fundraiser una tarjeta de cupones de GoodRx.  - Si necesita que su receta se enve electrnicamente a una farmacia diferente, informe a nuestra oficina a travs de MyChart de [place] o por telfono llamando al [phone] y presione la opcin 4.

## 2022-03-12 ENCOUNTER — Encounter: Payer: Self-pay | Admitting: Dermatology

## 2022-07-12 ENCOUNTER — Other Ambulatory Visit: Payer: Managed Care, Other (non HMO)

## 2022-07-15 ENCOUNTER — Other Ambulatory Visit: Payer: Managed Care, Other (non HMO) | Admitting: Urology

## 2022-09-17 IMAGING — CR DG ABDOMEN 1V
1 series · 2 of 2 positions shown · non-contrast
Comparison: 02/27/2020

CLINICAL DATA: Renal stones

EXAM:
ABDOMEN - 1 VIEW

[Series 1: dg abd 1 view · 0.14mm/px · 2 of 2 slices shown]
[im 1/2]
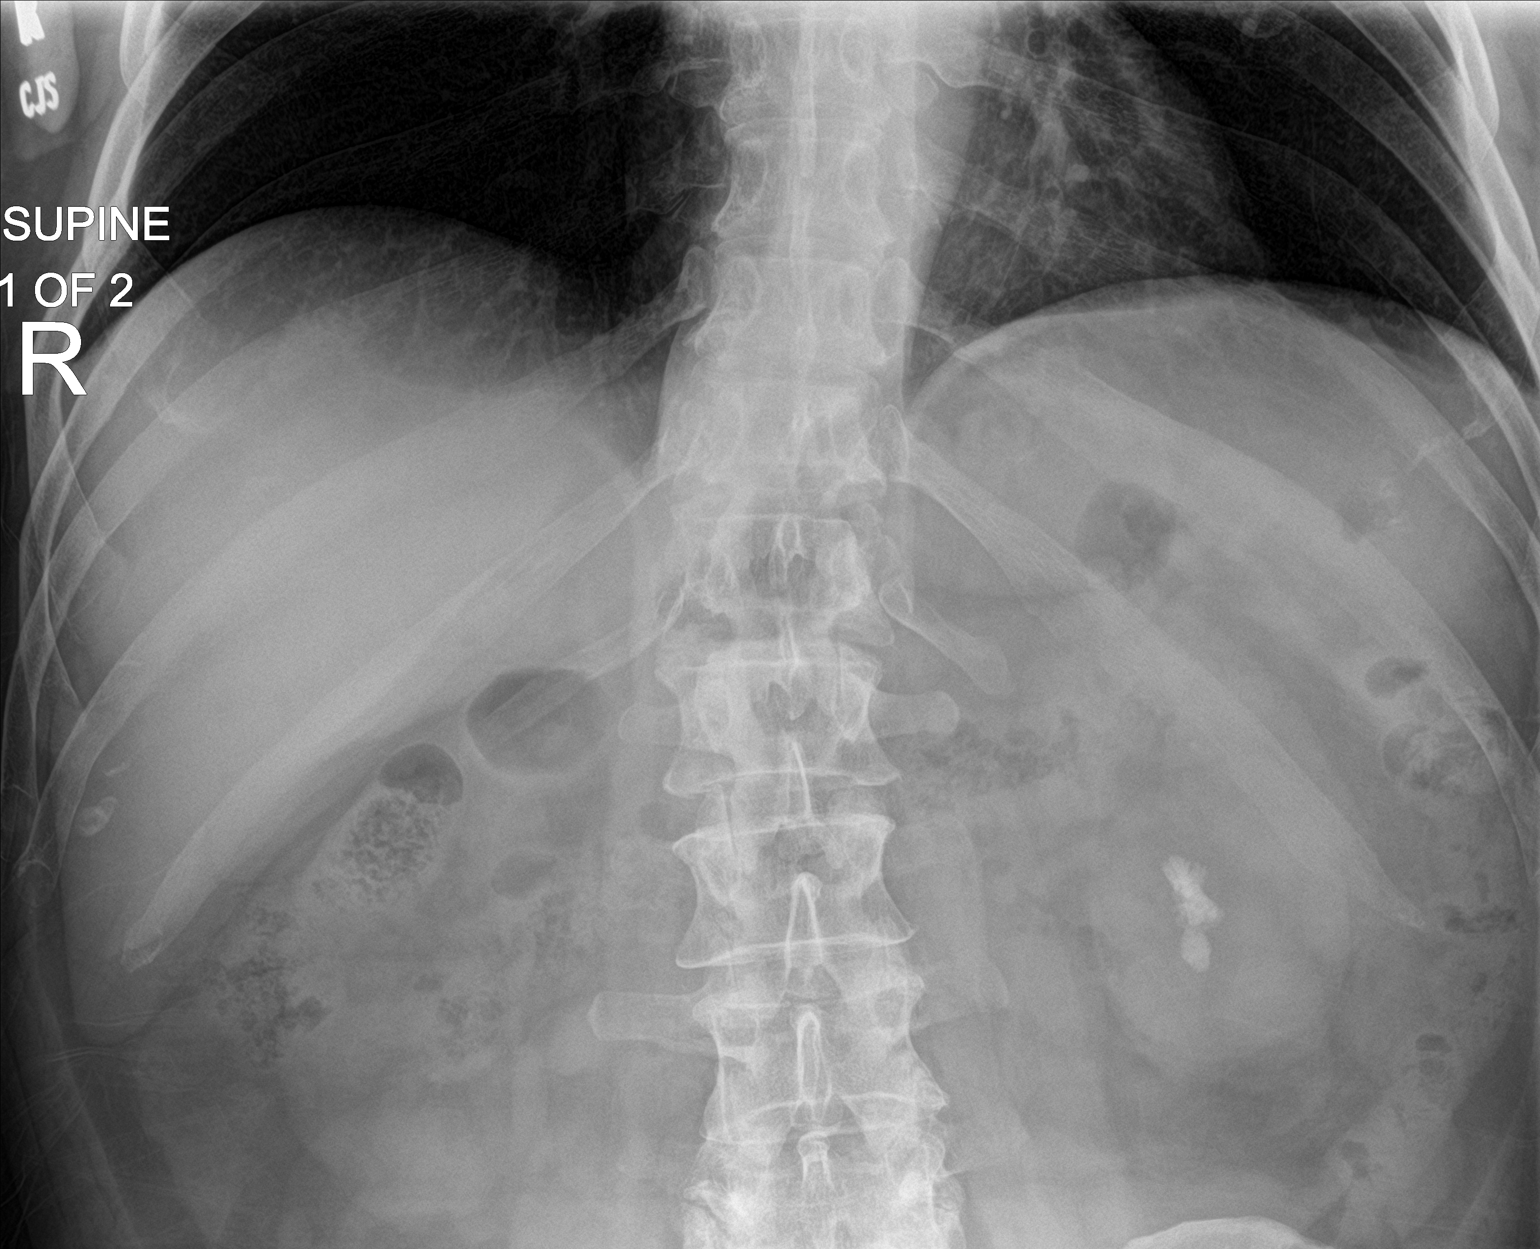
[im 2/2]
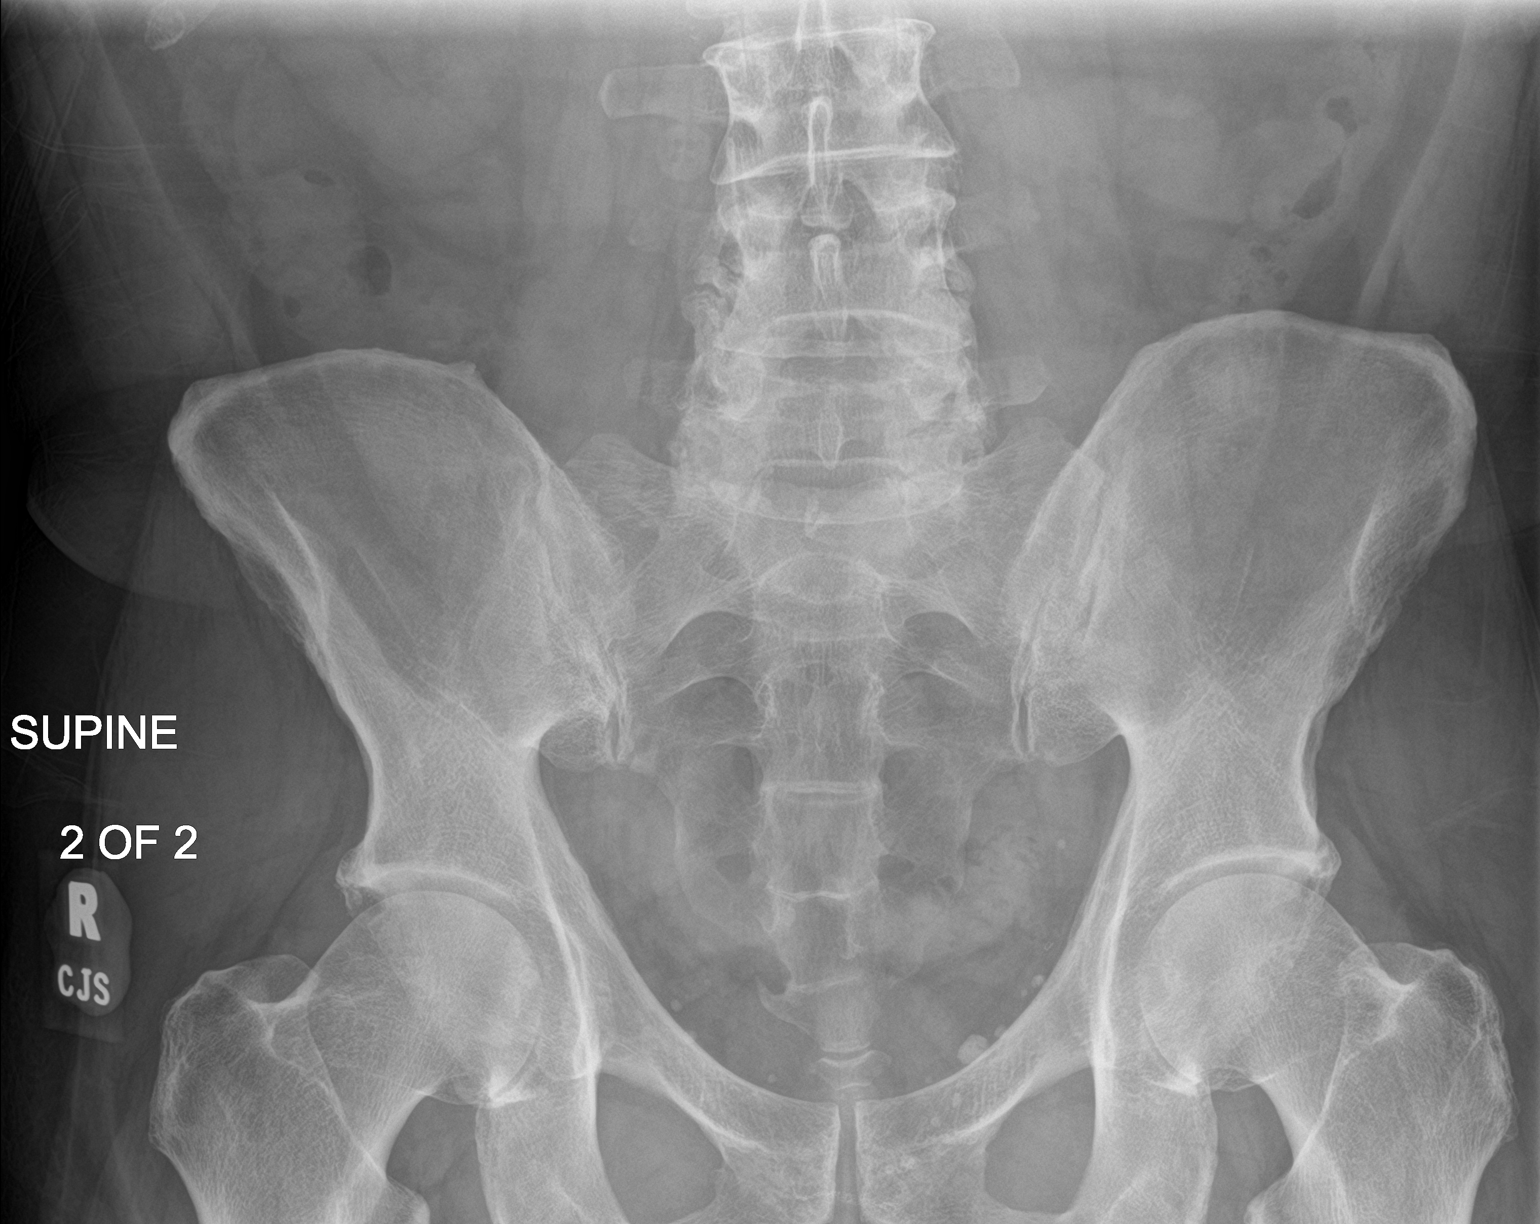

[2 of 2 positions shown; findings below may reference images not displayed]

FINDINGS: Bowel gas pattern is nonspecific. Small amount of stool is seen in
the colon. There are multiple left renal stones largest measuring
2.3 cm in maximum diameter. There is possible 3 mm tiny calculus in
the midportion of left kidney. Kidneys are partly obscured by bowel
contents. Phleboliths are seen in the pelvis. Visualized lower lung
fields are clear. There is mild dextroscoliosis in the lumbar spine.
Degenerative changes are noted in the lower lumbar spine.
IMPRESSION: There are multiple left renal stones with interval increase in size.

## 2022-09-29 ENCOUNTER — Ambulatory Visit: Payer: Medicare PPO | Admitting: Dermatology

## 2022-09-29 ENCOUNTER — Encounter: Payer: Self-pay | Admitting: Dermatology

## 2022-09-29 VITALS — BP 142/87

## 2022-09-29 DIAGNOSIS — D229 Melanocytic nevi, unspecified: Secondary | ICD-10-CM

## 2022-09-29 DIAGNOSIS — Z872 Personal history of diseases of the skin and subcutaneous tissue: Secondary | ICD-10-CM

## 2022-09-29 DIAGNOSIS — Z1283 Encounter for screening for malignant neoplasm of skin: Secondary | ICD-10-CM | POA: Diagnosis not present

## 2022-09-29 DIAGNOSIS — L738 Other specified follicular disorders: Secondary | ICD-10-CM

## 2022-09-29 DIAGNOSIS — L578 Other skin changes due to chronic exposure to nonionizing radiation: Secondary | ICD-10-CM | POA: Diagnosis not present

## 2022-09-29 DIAGNOSIS — L814 Other melanin hyperpigmentation: Secondary | ICD-10-CM | POA: Diagnosis not present

## 2022-09-29 DIAGNOSIS — L821 Other seborrheic keratosis: Secondary | ICD-10-CM

## 2022-09-29 DIAGNOSIS — R238 Other skin changes: Secondary | ICD-10-CM

## 2022-09-29 DIAGNOSIS — D239 Other benign neoplasm of skin, unspecified: Secondary | ICD-10-CM

## 2022-09-29 DIAGNOSIS — W908XXA Exposure to other nonionizing radiation, initial encounter: Secondary | ICD-10-CM | POA: Diagnosis not present

## 2022-09-29 DIAGNOSIS — C4491 Basal cell carcinoma of skin, unspecified: Secondary | ICD-10-CM | POA: Insufficient documentation

## 2022-09-29 DIAGNOSIS — D2372 Other benign neoplasm of skin of left lower limb, including hip: Secondary | ICD-10-CM

## 2022-09-29 DIAGNOSIS — L570 Actinic keratosis: Secondary | ICD-10-CM

## 2022-09-29 DIAGNOSIS — D1801 Hemangioma of skin and subcutaneous tissue: Secondary | ICD-10-CM

## 2022-09-29 DIAGNOSIS — Z86007 Personal history of in-situ neoplasm of skin: Secondary | ICD-10-CM

## 2022-09-29 DIAGNOSIS — Z85828 Personal history of other malignant neoplasm of skin: Secondary | ICD-10-CM

## 2022-09-29 NOTE — Progress Notes (Signed)
Follow-Up Visit   Subjective  Walter Hood is a 62 y.o. male who presents for the following: Skin Cancer Screening and Full Body Skin Exam  hx of BCC, SCC IS, Aks, pt thinks a spot on scalp Dr. Neale Burly txted last visit with LN2 did not resolve, last spot txted was AK L frontal scalp 09/02/20, check scaly spot L temple, not sure how long it has been there  The patient presents for Total-Body Skin Exam (TBSE) for skin cancer screening and mole check. The patient has spots, moles and lesions to be evaluated, some may be new or changing and the patient may have concern these could be cancer.    The following portions of the chart were reviewed this encounter and updated as appropriate: medications, allergies, medical history  Review of Systems:  No other skin or systemic complaints except as noted in HPI or Assessment and Plan.  Objective  Well appearing patient in no apparent distress; mood and affect are within normal limits.  A full examination was performed including scalp, head, eyes, ears, nose, lips, neck, chest, axillae, abdomen, back, buttocks, bilateral upper extremities, bilateral lower extremities, hands, feet, fingers, toes, fingernails, and toenails. All findings within normal limits unless otherwise noted below.   Relevant physical exam findings are noted in the Assessment and Plan.  L lat forehead x 1 Pink scaly macules    Assessment & Plan   SKIN CANCER SCREENING PERFORMED TODAY.  ACTINIC DAMAGE - Chronic condition, secondary to cumulative UV/sun exposure - diffuse scaly erythematous macules with underlying dyspigmentation - Recommend daily broad spectrum sunscreen SPF 30+ to sun-exposed areas, reapply every 2 hours as needed.  - Staying in the shade or wearing long sleeves, sun glasses (UVA+UVB protection) and wide brim hats (4-inch brim around the entire circumference of the hat) are also recommended for sun protection.  - Call for new or changing lesions. - cont  Heliocare qd  LENTIGINES, SEBORRHEIC KERATOSES, HEMANGIOMAS - Benign normal skin lesions - Benign-appearing - Call for any changes - SK scalp, Inferior portion of scar R cheek  MELANOCYTIC NEVI - Tan-brown and/or pink-flesh-colored symmetric macules and papules - Benign appearing on exam today - Observation - Call clinic for new or changing moles - Recommend daily use of broad spectrum spf 30+ sunscreen to sun-exposed areas.   HISTORY OF BASAL CELL CARCINOMA OF THE SKIN - No evidence of recurrence today - Recommend regular full body skin exams - Recommend daily broad spectrum sunscreen SPF 30+ to sun-exposed areas, reapply every 2 hours as needed.  - Call if any new or changing lesions are noted between office visits  - R perinasal cheek  HISTORY OF SQUAMOUS CELL CARCINOMA IN SITU OF THE SKIN - No evidence of recurrence today - Recommend regular full body skin exams - Recommend daily broad spectrum sunscreen SPF 30+ to sun-exposed areas, reapply every 2 hours as needed.  - Call if any new or changing lesions are noted between office visits  - Vertex scalp  Left perioral skin-coloured papule, covered by mustache hair - per patient, another provider was unable to get fluid out of the lesion  DERMATOFIBROMA L medial ankle Exam: Firm pink/brown papulenodule with dimple sign. Treatment Plan: A dermatofibroma is a benign growth possibly related to trauma, such as an insect bite, cut from shaving, or inflamed acne-type bump.  Treatment options to remove include shave or excision with resulting scar and risk of recurrence.  Since benign-appearing and not bothersome, will observe for now.  Assessment & Plan   Multiple benign nevi  Lentigines  Actinic elastosis  Seborrheic keratoses  Cherry angioma  Sebaceous hyperplasia  AK (actinic keratosis) L lat forehead x 1  Offered cryo vs biopsy. Patient opts for cryotherapy as below. Patient will return for biopsy if it does not  resolve  Actinic keratoses are precancerous spots that appear secondary to cumulative UV radiation exposure/sun exposure over time. They are chronic with expected duration over 1 year. A portion of actinic keratoses will progress to squamous cell carcinoma of the skin. It is not possible to reliably predict which spots will progress to skin cancer and so treatment is recommended to prevent development of skin cancer.  Recommend daily broad spectrum sunscreen SPF 30+ to sun-exposed areas, reapply every 2 hours as needed.  Recommend staying in the shade or wearing long sleeves, sun glasses (UVA+UVB protection) and wide brim hats (4-inch brim around the entire circumference of the hat). Call for new or changing lesions.  Destruction of lesion - L lat forehead x 1 Complexity: simple   Destruction method: cryotherapy   Informed consent: discussed and consent obtained   Timeout:  patient name, date of birth, surgical site, and procedure verified Lesion destroyed using liquid nitrogen: Yes   Region frozen until ice ball extended beyond lesion: Yes   Cryo cycles: 1 or 2. Outcome: patient tolerated procedure well with no complications   Post-procedure details: wound care instructions given    Dermatofibroma      Return in about 1 year (around 09/29/2023) for TBSE, Hx of BCC, Hx of SCC.  I, Ardis Rowan, RMA, am acting as scribe for Elie Goody, MD .   Documentation: I have reviewed the above documentation for accuracy and completeness, and I agree with the above.  Elie Goody, MD

## 2022-09-29 NOTE — Patient Instructions (Addendum)

## 2023-05-04 DIAGNOSIS — Z8546 Personal history of malignant neoplasm of prostate: Secondary | ICD-10-CM | POA: Diagnosis not present

## 2023-05-04 DIAGNOSIS — Z008 Encounter for other general examination: Secondary | ICD-10-CM | POA: Diagnosis not present

## 2023-05-04 DIAGNOSIS — Z6827 Body mass index (BMI) 27.0-27.9, adult: Secondary | ICD-10-CM | POA: Diagnosis not present

## 2023-05-04 DIAGNOSIS — E785 Hyperlipidemia, unspecified: Secondary | ICD-10-CM | POA: Diagnosis not present

## 2023-05-04 DIAGNOSIS — I10 Essential (primary) hypertension: Secondary | ICD-10-CM | POA: Diagnosis not present

## 2023-05-04 DIAGNOSIS — E663 Overweight: Secondary | ICD-10-CM | POA: Diagnosis not present

## 2023-07-19 DIAGNOSIS — K219 Gastro-esophageal reflux disease without esophagitis: Secondary | ICD-10-CM | POA: Diagnosis not present

## 2023-07-19 DIAGNOSIS — Z1331 Encounter for screening for depression: Secondary | ICD-10-CM | POA: Diagnosis not present

## 2023-07-19 DIAGNOSIS — Z0000 Encounter for general adult medical examination without abnormal findings: Secondary | ICD-10-CM | POA: Diagnosis not present

## 2023-10-02 ENCOUNTER — Ambulatory Visit: Payer: Medicare PPO | Admitting: Dermatology

## 2023-10-05 ENCOUNTER — Ambulatory Visit: Admitting: Dermatology

## 2023-10-19 ENCOUNTER — Ambulatory Visit: Admitting: Dermatology
